# Patient Record
Sex: Female | Born: 1980 | Hispanic: No | Marital: Married | Smoking: Never smoker
Health system: Southern US, Community
[De-identification: ages and names within clinical notes are randomized; demographics above are authoritative.]

## PROBLEM LIST (undated history)

## (undated) ENCOUNTER — Inpatient Hospital Stay (HOSPITAL_COMMUNITY): Payer: Self-pay

## (undated) DIAGNOSIS — K589 Irritable bowel syndrome without diarrhea: Secondary | ICD-10-CM

## (undated) DIAGNOSIS — M329 Systemic lupus erythematosus, unspecified: Secondary | ICD-10-CM

## (undated) DIAGNOSIS — IMO0002 Reserved for concepts with insufficient information to code with codable children: Secondary | ICD-10-CM

## (undated) HISTORY — PX: MULTIPLE TOOTH EXTRACTIONS: SHX2053

## (undated) HISTORY — DX: Irritable bowel syndrome, unspecified: K58.9

---

## 2010-08-14 ENCOUNTER — Emergency Department (HOSPITAL_COMMUNITY): Payer: Self-pay

## 2010-08-14 ENCOUNTER — Emergency Department (HOSPITAL_COMMUNITY)
Admission: EM | Admit: 2010-08-14 | Discharge: 2010-08-14 | Disposition: A | Discharge status: Home / Self Care | Payer: Self-pay | Attending: Emergency Medicine | Admitting: Emergency Medicine

## 2010-08-14 DIAGNOSIS — R1013 Epigastric pain: Secondary | ICD-10-CM | POA: Insufficient documentation

## 2010-08-14 DIAGNOSIS — B3731 Acute candidiasis of vulva and vagina: Secondary | ICD-10-CM | POA: Insufficient documentation

## 2010-08-14 DIAGNOSIS — O210 Mild hyperemesis gravidarum: Secondary | ICD-10-CM | POA: Insufficient documentation

## 2010-08-14 DIAGNOSIS — B373 Candidiasis of vulva and vagina: Secondary | ICD-10-CM | POA: Insufficient documentation

## 2010-08-14 DIAGNOSIS — R109 Unspecified abdominal pain: Secondary | ICD-10-CM | POA: Insufficient documentation

## 2010-08-14 DIAGNOSIS — O99891 Other specified diseases and conditions complicating pregnancy: Secondary | ICD-10-CM | POA: Insufficient documentation

## 2010-08-14 DIAGNOSIS — O2390 Unspecified genitourinary tract infection in pregnancy, unspecified trimester: Secondary | ICD-10-CM | POA: Insufficient documentation

## 2010-08-14 LAB — WET PREP, GENITAL
Clue Cells Wet Prep HPF POC: NONE SEEN
Trich, Wet Prep: NONE SEEN

## 2010-08-14 LAB — URINALYSIS, ROUTINE W REFLEX MICROSCOPIC
Bilirubin Urine: NEGATIVE
Glucose, UA: NEGATIVE mg/dL
Hgb urine dipstick: NEGATIVE
Ketones, ur: NEGATIVE mg/dL
Protein, ur: NEGATIVE mg/dL
Urobilinogen, UA: 0.2 mg/dL (ref 0.0–1.0)

## 2010-08-14 LAB — POCT PREGNANCY, URINE: Preg Test, Ur: POSITIVE

## 2010-08-14 LAB — URINE MICROSCOPIC-ADD ON

## 2010-08-15 LAB — GC/CHLAMYDIA PROBE AMP, GENITAL
Chlamydia, DNA Probe: NEGATIVE
GC Probe Amp, Genital: NEGATIVE

## 2010-08-25 ENCOUNTER — Inpatient Hospital Stay (HOSPITAL_COMMUNITY)
Admission: AD | Admit: 2010-08-25 | Discharge: 2010-08-25 | Disposition: A | Discharge status: Home / Self Care | Payer: Self-pay | Source: Ambulatory Visit | Attending: Obstetrics & Gynecology | Admitting: Obstetrics & Gynecology

## 2010-09-20 ENCOUNTER — Encounter (HOSPITAL_COMMUNITY): Payer: Self-pay

## 2010-09-20 ENCOUNTER — Inpatient Hospital Stay (HOSPITAL_COMMUNITY)
Admission: AD | Admit: 2010-09-20 | Discharge: 2010-09-20 | Disposition: A | Discharge status: Home / Self Care | Payer: Medicaid Other | Source: Ambulatory Visit | Attending: Obstetrics and Gynecology | Admitting: Obstetrics and Gynecology

## 2010-09-20 ENCOUNTER — Inpatient Hospital Stay (HOSPITAL_COMMUNITY): Payer: Medicaid Other

## 2010-09-20 DIAGNOSIS — O210 Mild hyperemesis gravidarum: Secondary | ICD-10-CM | POA: Insufficient documentation

## 2010-09-20 DIAGNOSIS — O219 Vomiting of pregnancy, unspecified: Secondary | ICD-10-CM

## 2010-09-20 DIAGNOSIS — O209 Hemorrhage in early pregnancy, unspecified: Secondary | ICD-10-CM

## 2010-09-20 LAB — URINE MICROSCOPIC-ADD ON

## 2010-09-20 LAB — URINALYSIS, ROUTINE W REFLEX MICROSCOPIC
Bilirubin Urine: NEGATIVE
Ketones, ur: NEGATIVE mg/dL
Nitrite: NEGATIVE
Specific Gravity, Urine: >1.03 — ABNORMAL HIGH (ref 1.005–1.030)
Urobilinogen, UA: 0.2 mg/dL (ref 0.0–1.0)

## 2010-09-20 MED ORDER — OMEPRAZOLE 20 MG PO CPDR: 20.0000 mg | DELAYED_RELEASE_CAPSULE | Freq: Every day | ORAL | Status: DC

## 2010-09-20 MED ORDER — ONDANSETRON 8 MG PO TBDP: 8.0000 mg | ORAL_TABLET | Freq: Three times a day (TID) | ORAL | Status: AC | PRN

## 2010-09-20 MED ORDER — ONDANSETRON 8 MG PO TBDP
8.0000 mg | ORAL_TABLET | Freq: Once | ORAL | Status: AC
  Administered 2010-09-20: 8 mg via ORAL
  Filled 2010-09-20: qty 1

## 2010-09-20 MED ORDER — PROMETHAZINE HCL 25 MG PO TABS: 25.0000 mg | ORAL_TABLET | Freq: Four times a day (QID) | ORAL | Status: DC | PRN

## 2010-09-20 MED ORDER — GI COCKTAIL ~~LOC~~
30.0000 mL | Freq: Once | ORAL | Status: AC
  Administered 2010-09-20: 30 mL via ORAL
  Filled 2010-09-20: qty 30

## 2010-09-20 NOTE — ED Provider Notes (Signed)
History   pt is 13weeks 5 days pregnant [id number] from [place] who presents with nausea and vomiting in pregnancy. Pt says that she vomits 3 to 4 times daily.  She said the doctor gave her 10 pills for nausea that helped, but she did not have any more.  She also has epigastric pain after she eats. She also says that she has been spotting most days.  She was seen in July with negative cultures and wet prep.  She has had some lower abdominal cramping and lower back ache.  Her husband wants her to return to [place] to deliver the baby, but she wants to stay in [place].  Pt does not know if it is safe for her to travel such a long distance.  Chief Complaint  Patient presents with  . Morning Sickness   The history is provided by the patient and the spouse. The history is limited by a language barrier.      No past medical history on file.  No past surgical history on file.  No family history on file.  History  Substance Use Topics  . Smoking status: Not on file  . Smokeless tobacco: Not on file  . Alcohol Use: Not on file    Allergies: Allergies not on file  No prescriptions prior to admission    ROS Physical Exam   Blood pressure 116/65, pulse 65, temperature 98.9 F (37.2 C), resp. rate 16, height 5' 2.25" (1.581 m), weight 187 lb 12.8 oz (85.186 kg), last menstrual period 06/16/2010.  Physical Exam  Vitals reviewed. Constitutional: She is oriented to person, place, and time. She appears well-developed and well-nourished.  HENT:  Head: Normocephalic.  Eyes: Pupils are equal, round, and reactive to light.  Neck: Normal range of motion. Neck supple.  Respiratory: Effort normal.  GI: Soft. There is no tenderness.  Genitourinary:       Circumcised female- vagina small amount of yellow discharge in vault- no blood in vault or on cotton tipped swab; cervix closed, nontender; uterus nontender- size difficult to appreciate due to habitus and position- FHT not audible with doppler    Neurological: She is alert and oriented to person, place, and time.  Skin: Skin is warm and dry.  Psychiatric: She has a normal mood and affect.    MAU Course  Procedures Review of ultraound and labs in July- viable single fetus [redacted]w[redacted]d on 08/14/2010 with [place] 03/19/2011 Negative wet prep and cultures from that date Ultrasound showed single living IUP [redacted]w[redacted]d with mod subchorionic bleed  FHR 152. Cervix normal appearance MDM   Assessment and Plan  IUP [redacted]w[redacted]d with mod subchorionic bleed Pt plans to return to [place] in 25 days and will proceed with OB care at that time Advised pt and husband to return if bleeding got heavier- hopefully the bleed will resolve Advised extra water/fluids and activity while traveling and compression stockings  Henriette Hesser 09/20/2010, 4:10 PM

## 2010-09-20 NOTE — Progress Notes (Signed)
Patient having nausea vomiting every day sometimes up to 3 times a day since found out pregnant has not been seen in MAU, had confirmed pregnancy at [facility]

## 2010-09-24 NOTE — ED Provider Notes (Signed)
Agree with above note.  Eileen Holmes 09/24/2010 8:46 AM   

## 2013-12-04 ENCOUNTER — Encounter (HOSPITAL_COMMUNITY): Payer: Self-pay

## 2014-10-24 ENCOUNTER — Encounter (HOSPITAL_COMMUNITY): Payer: Self-pay | Admitting: *Deleted

## 2014-10-24 ENCOUNTER — Inpatient Hospital Stay (HOSPITAL_COMMUNITY)
Admission: AD | Admit: 2014-10-24 | Discharge: 2014-10-24 | Disposition: A | Discharge status: Home / Self Care | Payer: Medicaid Other | Source: Ambulatory Visit | Attending: Family Medicine | Admitting: Family Medicine

## 2014-10-24 DIAGNOSIS — O219 Vomiting of pregnancy, unspecified: Secondary | ICD-10-CM | POA: Diagnosis not present

## 2014-10-24 DIAGNOSIS — O210 Mild hyperemesis gravidarum: Secondary | ICD-10-CM | POA: Diagnosis not present

## 2014-10-24 DIAGNOSIS — M549 Dorsalgia, unspecified: Secondary | ICD-10-CM

## 2014-10-24 DIAGNOSIS — K589 Irritable bowel syndrome without diarrhea: Secondary | ICD-10-CM

## 2014-10-24 DIAGNOSIS — O26891 Other specified pregnancy related conditions, first trimester: Secondary | ICD-10-CM | POA: Diagnosis not present

## 2014-10-24 DIAGNOSIS — Z3A08 8 weeks gestation of pregnancy: Secondary | ICD-10-CM | POA: Diagnosis not present

## 2014-10-24 DIAGNOSIS — O99891 Other specified diseases and conditions complicating pregnancy: Secondary | ICD-10-CM

## 2014-10-24 DIAGNOSIS — O9989 Other specified diseases and conditions complicating pregnancy, childbirth and the puerperium: Secondary | ICD-10-CM

## 2014-10-24 LAB — URINE MICROSCOPIC-ADD ON

## 2014-10-24 LAB — URINALYSIS, ROUTINE W REFLEX MICROSCOPIC
Bilirubin Urine: NEGATIVE
GLUCOSE, UA: NEGATIVE mg/dL
KETONES UR: NEGATIVE mg/dL
LEUKOCYTES UA: NEGATIVE
Nitrite: NEGATIVE
PH: 6 (ref 5.0–8.0)
Protein, ur: NEGATIVE mg/dL
Urobilinogen, UA: 0.2 mg/dL (ref 0.0–1.0)

## 2014-10-24 LAB — WET PREP, GENITAL
Clue Cells Wet Prep HPF POC: NONE SEEN
Trich, Wet Prep: NONE SEEN

## 2014-10-24 LAB — POCT PREGNANCY, URINE: Preg Test, Ur: POSITIVE — AB

## 2014-10-24 MED ORDER — DICYCLOMINE HCL 20 MG PO TABS: 20.0000 mg | ORAL_TABLET | Freq: Two times a day (BID) | ORAL | Status: DC | PRN

## 2014-10-24 MED ORDER — PROMETHAZINE HCL 25 MG PO TABS
25.0000 mg | ORAL_TABLET | Freq: Once | ORAL | Status: AC
  Administered 2014-10-24: 25 mg via ORAL
  Filled 2014-10-24: qty 1

## 2014-10-24 MED ORDER — PROMETHAZINE HCL 25 MG PO TABS: 25.0000 mg | ORAL_TABLET | Freq: Four times a day (QID) | ORAL | Status: DC | PRN

## 2014-10-24 MED ORDER — METOCLOPRAMIDE HCL 10 MG PO TABS
10.0000 mg | ORAL_TABLET | Freq: Once | ORAL | Status: AC
  Administered 2014-10-24: 10 mg via ORAL
  Filled 2014-10-24: qty 1

## 2014-10-24 MED ORDER — METOCLOPRAMIDE HCL 10 MG PO TABS: 10.0000 mg | ORAL_TABLET | Freq: Four times a day (QID) | ORAL | Status: DC | PRN

## 2014-10-24 NOTE — Discharge Instructions (Signed)
Bloating Bloating is the feeling of fullness in your belly. You may feel as though your pants are too tight. Often the cause of bloating is overeating, retaining fluids, or having gas in your bowel. It is also caused by swallowing air and eating foods that cause gas. Irritable bowel syndrome is one of the most common causes of bloating. Constipation is also a common cause. Sometimes more serious problems can cause bloating. SYMPTOMS  Usually there is a feeling of fullness, as though your abdomen is bulged out. There may be mild discomfort.  DIAGNOSIS  Usually no particular testing is necessary for most bloating. If the condition persists and seems to become worse, your caregiver may do additional testing.  TREATMENT   There is no direct treatment for bloating.  Do not put gas into the bowel. Avoid chewing gum and sucking on candy. These tend to make you swallow air. Swallowing air can also be a nervous habit. Try to avoid this.  Avoiding high residue diets will help. Eat foods with soluble fibers (examples include root vegetables, apples, or barley) and substitute dairy products with soy and rice products. This helps irritable bowel syndrome.  If constipation is the cause, then a high residue diet with more fiber will help.  Avoid carbonated beverages.  Over-the-counter preparations are available that help reduce gas. Your pharmacist can help you with this. SEEK MEDICAL CARE IF:   Bloating continues and seems to be getting worse.  You notice a weight gain.  You have a weight loss but the bloating is getting worse.  You have changes in your bowel habits or develop nausea or vomiting. SEEK IMMEDIATE MEDICAL CARE IF:   You develop shortness of breath or swelling in your legs.  You have an increase in abdominal pain or develop chest pain. Document Released: 11/19/2005 Document Revised: 04/13/2011 Document Reviewed: 01/07/2007 [facility] Patient Information 2015 [place], [place]. This  information is not intended to replace advice given to you by your health care provider. Make sure you discuss any questions you have with your health care provider.  Morning Sickness Morning sickness is when you feel sick to your stomach (nauseous) during pregnancy. This nauseous feeling may or may not come with vomiting. It often occurs in the morning but can be a problem any time of day. Morning sickness is most common during the first trimester, but it may continue throughout pregnancy. While morning sickness is unpleasant, it is usually harmless unless you develop severe and continual vomiting (hyperemesis gravidarum). This condition requires more intense treatment.  CAUSES  The cause of morning sickness is not completely known but seems to be related to normal hormonal changes that occur in pregnancy. RISK FACTORS You are at greater risk if you:  Experienced nausea or vomiting before your pregnancy.  Had morning sickness during a previous pregnancy.  Are pregnant with more than one baby, such as twins. TREATMENT  Do not use any medicines (prescription, over-the-counter, or herbal) for morning sickness without first talking to your health care provider. Your health care provider may prescribe or recommend:  Vitamin B6 supplements.  Anti-nausea medicines.  The herbal medicine ginger. HOME CARE INSTRUCTIONS   Only take over-the-counter or prescription medicines as directed by your health care provider.  Taking multivitamins before getting pregnant can prevent or decrease the severity of morning sickness in most women.  Eat a piece of dry toast or unsalted crackers before getting out of bed in the morning.  Eat five or six small meals a day.  Eat dry and bland foods (rice, baked potato). Foods high in carbohydrates are often helpful.  Do not drink liquids with your meals. Drink liquids between meals.  Avoid greasy, fatty, and spicy foods.  Get someone to cook for you if the  smell of any food causes nausea and vomiting.  If you feel nauseous after taking prenatal vitamins, take the vitamins at night or with a snack.  Snack on protein foods (nuts, yogurt, cheese) between meals if you are hungry.  Eat unsweetened gelatins for desserts.  Wearing an acupressure wristband (worn for sea sickness) may be helpful.  Acupuncture may be helpful.  Do not smoke.  Get a humidifier to keep the air in your house free of odors.  Get plenty of fresh air. SEEK MEDICAL CARE IF:   Your home remedies are not working, and you need medicine.  You feel dizzy or lightheaded.  You are losing weight. SEEK IMMEDIATE MEDICAL CARE IF:   You have persistent and uncontrolled nausea and vomiting.  You pass out (faint). MAKE SURE YOU:  Understand these instructions.  Will watch your condition.  Will get help right away if you are not doing well or get worse. Document Released: 03/12/2006 Document Revised: 01/24/2013 Document Reviewed: 07/06/2012 [facility] Patient Information 2015 [place], [place]. This information is not intended to replace advice given to you by your health care provider. Make sure you discuss any questions you have with your health care provider.  Prenatal Care [facility]    [facility]  [phone]     Phone: [phone]          [facility]                      [facility]   [place]     Phone: [phone]  Phone: [phone]         [facility]     Phone: [phone]  Phone: [phone]           [facility] [place]                hone: [phone]  Phone: [phone]         [facility] [address]      Phone: [phone]  Phone: [phone]         [facility]                Phone: [phone]  [facility]    [address] [place])          Phone: [phone] [facility]   Phone: [phone]

## 2014-10-24 NOTE — MAU Provider Note (Signed)
History     CSN: [id number]  Arrival date and time: 10/24/14 1417   First Provider Initiated Contact with Patient 10/24/14 1622      Chief Complaint  Patient presents with  . Emesis  . Back Pain   HPI This is a 34 y.o. female at [redacted]w[redacted]d by LMP who presents with c/o vomiting for the past 2-3 weeks. Has not tried anything for this.  Has also had some intermittent low back pain. Has occasional headaches.   Has a longstanding problem with IBS-Diarrhea and was taking a medication prescribed in [place] called Trimebutine for IBS-D (not recommended in pregnancy).  She stopped it when she found out she was pregnant. Wants something for the intestinal cramps she gets with this. Denies vaginal bleeding.   RN Note: Pos UPT @ [facility] 2 weeks ago, has been vomiting everything for the last 2-3 weeks. Also has had lower back pain for 3 days, intermittent HA. Denies bleeding or discharge.           OB History    Gravida Para Term Preterm AB TAB SAB Ectopic Multiple Living   Past Medical History  Diagnosis Date  . No pertinent past medical history     Past Surgical History  Procedure Laterality Date  . Cesarean section      No family history on file.  Social History  Substance Use Topics  . Smoking status: Never Smoker   . Smokeless tobacco: Not on file  . Alcohol Use: No    Allergies: No Known Allergies  Prescriptions prior to admission  Medication Sig Dispense Refill Last Dose  . omeprazole (PRILOSEC) 20 MG capsule Take 1 capsule (20 mg total) by mouth daily. 30 capsule 0   . ondansetron (ZOFRAN-ODT) 8 MG disintegrating tablet Take 8 mg by mouth once. For nausea   09/05/2010 at Unknown  . PRESCRIPTION MEDICATION         Review of Systems  Constitutional: Negative for fever, chills and malaise/fatigue.  Eyes: Negative for blurred vision.  Gastrointestinal: Positive for nausea, vomiting and diarrhea (IBS type). Negative for abdominal pain and  constipation.  Genitourinary: Negative for dysuria and flank pain.  Musculoskeletal: Positive for back pain.  Neurological: Positive for headaches. Negative for dizziness and weakness.   Physical Exam   Blood pressure 119/77, pulse 65, temperature 98.3 F (36.8 C), temperature source Oral, resp. rate 18, height 5' 5.5" (1.664 m), weight 192 lb 12.8 oz (87.454 kg), last menstrual period 08/27/2014, unknown if currently breastfeeding.  Physical Exam  Constitutional: She is oriented to person, place, and time. She appears well-developed and well-nourished. No distress.  HENT:  Head: Normocephalic.  Cardiovascular: Normal rate and regular rhythm.   Respiratory: Effort normal and breath sounds normal. No respiratory distress.  GI: Soft. She exhibits no distension. There is no tenderness. There is no rebound and no guarding.  Genitourinary: Vagina normal. No vaginal discharge found.  Bedside US done which showed a viable live fetus c/w dates at 8-9 weeks (but I did not do CRL).  FHR 160s. Fetus moved during exam. Normal amniotic sac. Yolk sac seen.   Cultures and wet prep obtained  Musculoskeletal: Normal range of motion.  Neurological: She is alert and oriented to person, place, and time.  Skin: Skin is warm and dry.  Psychiatric: She has a normal mood and affect.    MAU Course  Procedures  MDM Cultures and  wet prep obtained Bedside US done showing live fetus.  Phenergan and Reglan given PO with good results for nausea. States nausea improved. No overt dehydration noted, VS are stable I don't feel she needs IV hydration at this time She is able to keep down POs with oral medication  Results for Eileen, Holmes (MRN [id number]) as of 10/27/2014 23:55  Ref. Range 10/24/2014 17:06  Yeast Wet Prep HPF POC Latest Ref Range: NONE SEEN  FEW (A)  Trich, Wet Prep Latest Ref Range: NONE SEEN  NONE SEEN  Clue Cells Wet Prep HPF POC Latest Ref Range: NONE SEEN  NONE SEEN  WBC, Wet Prep HPF POC  Latest Ref Range: NONE SEEN  FEW (A)  Results for Eileen, Holmes (MRN [id number]) as of 10/27/2014 23:55  Ref. Range 10/24/2014 15:10  Appearance Latest Ref Range: CLEAR  CLEAR  Bacteria, UA Latest Ref Range: RARE  RARE  Bilirubin Urine Latest Ref Range: NEGATIVE  NEGATIVE  Color, Urine Latest Ref Range: YELLOW  YELLOW  Glucose Latest Ref Range: NEGATIVE mg/dL NEGATIVE  Hgb urine dipstick Latest Ref Range: NEGATIVE  TRACE (A)  Ketones, ur Latest Ref Range: NEGATIVE mg/dL NEGATIVE  Leukocytes, UA Latest Ref Range: NEGATIVE  NEGATIVE  Nitrite Latest Ref Range: NEGATIVE  NEGATIVE  pH Latest Ref Range: 5.0-8.0  6.0  Protein Latest Ref Range: NEGATIVE mg/dL NEGATIVE  RBC / HPF Latest Ref Range: <3 RBC/hpf 0-2  Specific Gravity, Urine Latest Ref Range: 1.005-1.030  >1.030 (H)  Squamous Epithelial / LPF Latest Ref Range: RARE  RARE  Urine-Other Unknown MUCOUS PRESENT  Urobilinogen, UA Latest Ref Range: 0.0-1.0 mg/dL 0.2  WBC, UA Latest Ref Range: <3 WBC/hpf 0-2    Assessment and Plan  A:  SIUP at [redacted]w[redacted]d        Nausea and vomiting of pregnancy       IBS-Diarrhea with cramping       Live fetus  P:  Discharge home       Discussed findings       Reviewed she should not take med from [place], not safe in pregnancy       Will give Rx for Bentyl for GI cramps       Rx's provided for Phenergan and Reglan      Encouraged to start prenatal care, list of providers given         Medication List    STOP taking these medications        omeprazole 20 MG capsule  Commonly known as:  PRILOSEC      TAKE these medications        dicyclomine 20 MG tablet  Commonly known as:  BENTYL  Take 1 tablet (20 mg total) by mouth 2 (two) times daily as needed for spasms.     metoCLOPramide 10 MG tablet  Commonly known as:  REGLAN  Take 1 tablet (10 mg total) by mouth every 6 (six) hours as needed for nausea.     prenatal multivitamin Tabs tablet  Take 1 tablet by mouth daily at 12 noon.     promethazine  25 MG tablet  Commonly known as:  PHENERGAN  Take 1 tablet (25 mg total) by mouth every 6 (six) hours as needed for nausea or vomiting.         [name] 10/24/2014, 4:22 PM

## 2014-10-24 NOTE — MAU Note (Signed)
Pos UPT @ [facility]  2 weeks ago, has been vomiting everything for the last 2-3 weeks.  Also has had lower back pain for 3 days, intermittent HA.  Denies bleeding or discharge.

## 2014-10-25 LAB — GC/CHLAMYDIA PROBE AMP (~~LOC~~) NOT AT ARMC
CHLAMYDIA, DNA PROBE: NEGATIVE
NEISSERIA GONORRHEA: NEGATIVE

## 2014-10-27 ENCOUNTER — Encounter (HOSPITAL_COMMUNITY): Payer: Self-pay | Admitting: Advanced Practice Midwife

## 2014-11-22 ENCOUNTER — Other Ambulatory Visit (HOSPITAL_COMMUNITY): Payer: Self-pay | Admitting: Nurse Practitioner

## 2014-11-22 DIAGNOSIS — Z3A13 13 weeks gestation of pregnancy: Secondary | ICD-10-CM

## 2014-11-22 DIAGNOSIS — O09522 Supervision of elderly multigravida, second trimester: Secondary | ICD-10-CM

## 2014-11-22 DIAGNOSIS — Z3682 Encounter for antenatal screening for nuchal translucency: Secondary | ICD-10-CM

## 2014-11-22 LAB — OB RESULTS CONSOLE RUBELLA ANTIBODY, IGM: RUBELLA: IMMUNE

## 2014-11-22 LAB — OB RESULTS CONSOLE GC/CHLAMYDIA
CHLAMYDIA, DNA PROBE: NEGATIVE
GC PROBE AMP, GENITAL: NEGATIVE

## 2014-11-22 LAB — OB RESULTS CONSOLE RPR: RPR: NONREACTIVE

## 2014-11-22 LAB — OB RESULTS CONSOLE HIV ANTIBODY (ROUTINE TESTING): HIV: NONREACTIVE

## 2014-11-22 LAB — OB RESULTS CONSOLE HEPATITIS B SURFACE ANTIGEN: Hepatitis B Surface Ag: NEGATIVE

## 2014-11-22 LAB — OB RESULTS CONSOLE ABO/RH: RH Type: NEGATIVE

## 2014-11-22 LAB — OB RESULTS CONSOLE ANTIBODY SCREEN: ANTIBODY SCREEN: NEGATIVE

## 2014-11-27 ENCOUNTER — Ambulatory Visit (HOSPITAL_COMMUNITY)
Admission: RE | Admit: 2014-11-27 | Discharge: 2014-11-27 | Disposition: A | Discharge status: Home / Self Care | Payer: Medicaid Other | Source: Ambulatory Visit | Attending: Nurse Practitioner | Admitting: Nurse Practitioner

## 2014-11-27 ENCOUNTER — Encounter: Payer: Medicaid Other | Admitting: Advanced Practice Midwife

## 2014-11-27 ENCOUNTER — Encounter (HOSPITAL_COMMUNITY): Payer: Self-pay

## 2014-11-27 ENCOUNTER — Other Ambulatory Visit (HOSPITAL_COMMUNITY): Payer: Self-pay | Admitting: Nurse Practitioner

## 2014-11-27 DIAGNOSIS — Z3682 Encounter for antenatal screening for nuchal translucency: Secondary | ICD-10-CM

## 2014-11-27 DIAGNOSIS — Z3A13 13 weeks gestation of pregnancy: Secondary | ICD-10-CM

## 2014-11-27 DIAGNOSIS — O09522 Supervision of elderly multigravida, second trimester: Secondary | ICD-10-CM

## 2014-11-27 DIAGNOSIS — O09529 Supervision of elderly multigravida, unspecified trimester: Secondary | ICD-10-CM

## 2014-11-27 DIAGNOSIS — O34219 Maternal care for unspecified type scar from previous cesarean delivery: Secondary | ICD-10-CM

## 2014-11-27 DIAGNOSIS — Z36 Encounter for antenatal screening of mother: Secondary | ICD-10-CM | POA: Insufficient documentation

## 2014-11-27 DIAGNOSIS — O09291 Supervision of pregnancy with other poor reproductive or obstetric history, first trimester: Secondary | ICD-10-CM

## 2014-11-27 DIAGNOSIS — Z3491 Encounter for supervision of normal pregnancy, unspecified, first trimester: Secondary | ICD-10-CM

## 2014-11-27 DIAGNOSIS — O09521 Supervision of elderly multigravida, first trimester: Secondary | ICD-10-CM

## 2014-11-29 ENCOUNTER — Encounter (HOSPITAL_COMMUNITY): Payer: Self-pay

## 2014-11-29 DIAGNOSIS — O09529 Supervision of elderly multigravida, unspecified trimester: Secondary | ICD-10-CM | POA: Insufficient documentation

## 2014-11-29 DIAGNOSIS — Z3A13 13 weeks gestation of pregnancy: Secondary | ICD-10-CM | POA: Insufficient documentation

## 2014-11-29 NOTE — Progress Notes (Signed)
Genetic Counseling  High-Risk Gestation Note  Appointment Date:  11/27/2014 Referred By: Eileen Ao, NP Date of Birth:  [birth year]   Pregnancy History: [id number] Estimated Date of Delivery: 05/29/15  Estimated Gestational Age: [redacted]w[redacted]d Attending: Particia Nearing, MD   Ms. Eileen Holmes was seen for genetic counseling because of a maternal age of 34 y.o..   She will be 34 years old at delivery. [employer] Arabic/English interpreter provided interpretation for today's visit. [employer] Genetic counseling Intern, Eileen Holmes, assisted with genetic counseling under my direct supervision.   In Summary:  Maternal age 89 years old at delivery; Reviewed associated risks for fetal aneuploidy  Patient elected for NIPS (Panorama) today; declined amniocentesis  Nuchal translucency ultrasound performed today; within normal range  Advanced paternal age; Discussed associations and additional option of third trimester growth ultrasound  Detailed ultrasound scheduled for 01/01/15  She was counseled regarding maternal age and the association with risk for chromosome conditions due to nondisjunction with aging of the ova.   We reviewed chromosomes, nondisjunction, and the associated 1 in 51 risk for fetal aneuploidy at [redacted]w[redacted]d gestation related to a maternal age of 34 years old at delivery.  She was counseled that the risk for aneuploidy decreases as gestational age increases, accounting for those pregnancies which spontaneously abort.  We specifically discussed Down syndrome (trisomy 54), trisomies 24 and 68, and sex chromosome aneuploidies (47,XXX and 47,XXY) including the common features and prognoses of each.   We reviewed available screening options including First Screen, Quad screen, noninvasive prenatal screening (NIPS)/cell free DNA (cfDNA) testing, and detailed ultrasound.  She was counseled that screening tests are used to modify a patient's a priori risk for aneuploidy, typically based on age. This estimate  provides a pregnancy specific risk assessment. We reviewed the benefits and limitations of each option. Specifically, we discussed the conditions for which each test screens, the detection rates, and false positive rates of each. She was also counseled regarding diagnostic testing via [facility] and amniocentesis. We reviewed the approximate 1 in 300-500 risk for complications for amniocentesis, including spontaneous pregnancy loss. After consideration of all the options, she elected to proceed with NIPS (Panorama through [facility] laboratory).  Those results will be available in 8-10 days.  She declined amniocentesis.   She also had nuchal translucency ultrasound performed today.  The report will be documented separately.  The patient would like to return for a detailed ultrasound at ~18+ weeks gestation.  This appointment was scheduled today. She understands that screening tests cannot rule out all birth defects or genetic syndromes. The patient was advised of this limitation and states she still does not want additional testing at this time.   Ms. Eileen Holmes was provided with written information regarding cystic fibrosis (CF) including the carrier frequency and incidence in the African population, the availability of carrier testing and prenatal diagnosis if indicated.  In addition, we discussed that CF is routinely screened for as part of the [place] newborn screening panel.  She previously had CF carrier screening through her OB office, which was negative for the mutations assessed. Thus, her risk to be a CF carrier has been reduced.   Both family histories were reviewed and found to be noncontributory for birth defects, intellectual disability, and known genetic conditions. Without further information regarding the provided family history, an accurate genetic risk cannot be calculated. Further genetic counseling is warranted if more information is obtained.  The father of the pregnancy is reportedly 44 years old.  She was counseled  that advanced paternal age (APA) is defined as paternal age greater than or equal to age 35.  Recent large-scale sequencing studies have shown that approximately 80% of de novo point mutations are of paternal origin.  Many studies have demonstrated a strong correlation between increased paternal age and de novo point mutations.  Although no specific data is available regarding fetal risks for fathers 2445+ years old at conception, it is apparent that the overall risk for single gene conditions is increased.  To estimate the relative increase in risk of a genetic disorder with APA, the heritability of the disease must be considered.  Assuming an approximate 2x increase in risk for conditions that are exclusively paternal in origin, the risk for each individual condition is still relatively low.  It is estimated that the overall chance for a de novo mutation is ~0.5%.  We also discussed the wide range of conditions which can be caused by new dominant gene mutations (achondroplasia, neurofibromatosis, Marfan syndrome etc.).  They were counseled that genetic testing for each individual single gene condition is not warranted or available unless ultrasound or family history concerns lend suspicion to a specific condition.  We discussed the recommendation for a detailed ultrasound at 18+ weeks gestation and a follow up ultrasound at ~28 weeks to monitor fetal growth.  Ms. Eileen Holmes denied exposure to environmental toxins or chemical agents. She denied the use of alcohol, tobacco or street drugs. She denied significant viral illnesses during the course of her pregnancy. Her medical and surgical histories were noncontributory.   I counseled Ms. Eileen Holmes regarding the above risks and available options.  The approximate face-to-face time with the genetic counselor was 45 minutes.  Eileen PlowmanKaren Pavle Wiler, MS,  Certified Genetic Counselor 11/29/2014

## 2014-12-06 ENCOUNTER — Telehealth (HOSPITAL_COMMUNITY): Payer: Self-pay | Admitting: MS"

## 2014-12-06 NOTE — Telephone Encounter (Signed)
Called Eileen Holmes to discuss her prenatal cell free DNA test results.  Eileen Holmes had Panorama testing through [facility] laboratories.  Testing was offered because of advanced maternal age.   The patient was identified by name and DOB.  We reviewed that these are within normal limits, showing a less than 1 in 10,000 risk for trisomies 21, 18 and 13, and monosomy X (Turner syndrome).  In addition, the risk for triploidy/vanishing twin and sex chromosome trisomies (47,XXX and 47,XXY) was also low risk. We reviewed that this testing identifies > 99% of pregnancies with trisomy 5621, trisomy 6813, sex chromosome trisomies (47,XXX and 47,XXY), and triploidy. The detection rate for trisomy 18 is 96%.  The detection rate for monosomy X is ~92%.  The false positive rate is <0.1% for all conditions. Testing was also consistent with female fetal sex.  She understands that this testing does not identify all genetic conditions.  All questions were answered to her satisfaction, she was encouraged to call with additional questions or concerns.  Quinn PlowmanKaren Nita Whitmire, MS Patent attorneyCertified Genetic Counselor

## 2015-01-01 ENCOUNTER — Ambulatory Visit (HOSPITAL_COMMUNITY)
Admission: RE | Admit: 2015-01-01 | Discharge: 2015-01-01 | Disposition: A | Discharge status: Home / Self Care | Payer: Medicaid Other | Source: Ambulatory Visit | Attending: Maternal and Fetal Medicine | Admitting: Maternal and Fetal Medicine

## 2015-01-01 ENCOUNTER — Encounter (HOSPITAL_COMMUNITY): Payer: Self-pay

## 2015-01-01 ENCOUNTER — Other Ambulatory Visit (HOSPITAL_COMMUNITY): Payer: Self-pay | Admitting: Maternal and Fetal Medicine

## 2015-01-01 VITALS — BP 112/68 | HR 83 | Wt 206.8 lb

## 2015-01-01 DIAGNOSIS — Z3A18 18 weeks gestation of pregnancy: Secondary | ICD-10-CM

## 2015-01-01 DIAGNOSIS — O09899 Supervision of other high risk pregnancies, unspecified trimester: Secondary | ICD-10-CM

## 2015-01-01 DIAGNOSIS — Z36 Encounter for antenatal screening of mother: Secondary | ICD-10-CM | POA: Insufficient documentation

## 2015-01-01 DIAGNOSIS — O09522 Supervision of elderly multigravida, second trimester: Secondary | ICD-10-CM

## 2015-01-01 DIAGNOSIS — Z98891 History of uterine scar from previous surgery: Secondary | ICD-10-CM

## 2015-01-01 DIAGNOSIS — O34219 Maternal care for unspecified type scar from previous cesarean delivery: Secondary | ICD-10-CM | POA: Diagnosis not present

## 2015-01-01 DIAGNOSIS — O09292 Supervision of pregnancy with other poor reproductive or obstetric history, second trimester: Secondary | ICD-10-CM | POA: Insufficient documentation

## 2015-01-01 DIAGNOSIS — O09529 Supervision of elderly multigravida, unspecified trimester: Secondary | ICD-10-CM

## 2015-01-01 DIAGNOSIS — O99119 Other diseases of the blood and blood-forming organs and certain disorders involving the immune mechanism complicating pregnancy, unspecified trimester: Secondary | ICD-10-CM

## 2015-01-01 DIAGNOSIS — O09299 Supervision of pregnancy with other poor reproductive or obstetric history, unspecified trimester: Secondary | ICD-10-CM

## 2015-01-03 ENCOUNTER — Other Ambulatory Visit (HOSPITAL_COMMUNITY): Payer: Self-pay

## 2015-02-26 ENCOUNTER — Other Ambulatory Visit (HOSPITAL_COMMUNITY): Payer: Self-pay | Admitting: Maternal and Fetal Medicine

## 2015-02-26 ENCOUNTER — Ambulatory Visit (HOSPITAL_COMMUNITY)
Admission: RE | Admit: 2015-02-26 | Discharge: 2015-02-26 | Disposition: A | Discharge status: Home / Self Care | Payer: Medicaid Other | Source: Ambulatory Visit | Attending: Nurse Practitioner | Admitting: Nurse Practitioner

## 2015-02-26 ENCOUNTER — Encounter (HOSPITAL_COMMUNITY): Payer: Self-pay

## 2015-02-26 DIAGNOSIS — D689 Coagulation defect, unspecified: Secondary | ICD-10-CM

## 2015-02-26 DIAGNOSIS — O09529 Supervision of elderly multigravida, unspecified trimester: Secondary | ICD-10-CM

## 2015-02-26 DIAGNOSIS — Z3A26 26 weeks gestation of pregnancy: Secondary | ICD-10-CM

## 2015-02-26 DIAGNOSIS — O34219 Maternal care for unspecified type scar from previous cesarean delivery: Secondary | ICD-10-CM | POA: Insufficient documentation

## 2015-02-26 DIAGNOSIS — O09899 Supervision of other high risk pregnancies, unspecified trimester: Secondary | ICD-10-CM

## 2015-02-26 DIAGNOSIS — O09292 Supervision of pregnancy with other poor reproductive or obstetric history, second trimester: Secondary | ICD-10-CM

## 2015-02-26 DIAGNOSIS — O99119 Other diseases of the blood and blood-forming organs and certain disorders involving the immune mechanism complicating pregnancy, unspecified trimester: Secondary | ICD-10-CM

## 2015-02-26 DIAGNOSIS — O09522 Supervision of elderly multigravida, second trimester: Secondary | ICD-10-CM | POA: Diagnosis not present

## 2015-03-13 ENCOUNTER — Encounter (HOSPITAL_COMMUNITY): Payer: Self-pay | Admitting: Obstetrics & Gynecology

## 2015-03-26 ENCOUNTER — Encounter (HOSPITAL_COMMUNITY): Payer: Self-pay

## 2015-03-26 ENCOUNTER — Ambulatory Visit (HOSPITAL_COMMUNITY)
Admission: RE | Admit: 2015-03-26 | Discharge: 2015-03-26 | Disposition: A | Discharge status: Home / Self Care | Payer: Medicaid Other | Source: Ambulatory Visit | Attending: Nurse Practitioner | Admitting: Nurse Practitioner

## 2015-03-26 ENCOUNTER — Encounter (HOSPITAL_COMMUNITY): Payer: Self-pay | Admitting: *Deleted

## 2015-03-26 DIAGNOSIS — Z3A31 31 weeks gestation of pregnancy: Secondary | ICD-10-CM | POA: Diagnosis not present

## 2015-03-26 DIAGNOSIS — O09293 Supervision of pregnancy with other poor reproductive or obstetric history, third trimester: Secondary | ICD-10-CM | POA: Diagnosis not present

## 2015-03-26 DIAGNOSIS — O34219 Maternal care for unspecified type scar from previous cesarean delivery: Secondary | ICD-10-CM | POA: Diagnosis not present

## 2015-03-26 DIAGNOSIS — O09529 Supervision of elderly multigravida, unspecified trimester: Secondary | ICD-10-CM

## 2015-04-27 ENCOUNTER — Other Ambulatory Visit: Payer: Self-pay | Admitting: Obstetrics & Gynecology

## 2015-05-02 LAB — OB RESULTS CONSOLE GBS: GBS: NEGATIVE

## 2015-05-07 ENCOUNTER — Encounter (HOSPITAL_COMMUNITY): Payer: Self-pay

## 2015-05-07 ENCOUNTER — Ambulatory Visit (HOSPITAL_COMMUNITY)
Admission: RE | Admit: 2015-05-07 | Discharge: 2015-05-07 | Disposition: A | Discharge status: Home / Self Care | Payer: Medicaid Other | Source: Ambulatory Visit | Attending: Nurse Practitioner | Admitting: Nurse Practitioner

## 2015-05-07 DIAGNOSIS — O09529 Supervision of elderly multigravida, unspecified trimester: Secondary | ICD-10-CM | POA: Diagnosis not present

## 2015-05-07 DIAGNOSIS — O09299 Supervision of pregnancy with other poor reproductive or obstetric history, unspecified trimester: Secondary | ICD-10-CM | POA: Diagnosis not present

## 2015-05-07 DIAGNOSIS — Z3A36 36 weeks gestation of pregnancy: Secondary | ICD-10-CM | POA: Insufficient documentation

## 2015-05-07 DIAGNOSIS — O2690 Pregnancy related conditions, unspecified, unspecified trimester: Secondary | ICD-10-CM | POA: Insufficient documentation

## 2015-05-14 ENCOUNTER — Encounter (HOSPITAL_COMMUNITY): Payer: Self-pay | Admitting: *Deleted

## 2015-05-14 ENCOUNTER — Telehealth (HOSPITAL_COMMUNITY): Payer: Self-pay | Admitting: *Deleted

## 2015-05-14 NOTE — Telephone Encounter (Signed)
Preadmission screen Interpreter number [phone]

## 2015-05-15 ENCOUNTER — Encounter (HOSPITAL_COMMUNITY): Payer: Self-pay | Admitting: *Deleted

## 2015-05-20 NOTE — Patient Instructions (Signed)
20 Meda Klinefeltereven Delo  05/20/2015   Your procedure is scheduled on:  05/21/2015  Enter through the Main Entrance of [facility] at 0900 AM.  Pick up the phone at the desk and dial [phone].   Call this number if you have problems the morning of surgery: [phone]   Remember:   Do not eat food:After Midnight.  Do not drink clear liquids: After Midnight.  Take these medicines the morning of surgery with A SIP OF WATER: none   Do not wear jewelry, make-up or nail polish.  Do not wear lotions, powders, or perfumes. You may wear deodorant.  Do not shave 48 hours prior to surgery.  Do not bring valuables to the hospital.  [facility] is not   responsible for any belongings or valuables brought to the hospital.  Contacts, dentures or bridgework may not be worn into surgery.  Leave suitcase in the car. After surgery it may be brought to your room.  For patients admitted to the hospital, checkout time is 11:00 AM the day of              discharge.   Patients discharged the day of surgery will not be allowed to drive             home.  Name and phone number of your driver: na  Special Instructions:   Shower using CHG 2 nights before surgery and the night before surgery.  If you shower the day of surgery use CHG.  Use special wash - you have one bottle of CHG for all showers.  You should use approximately 1/3 of the bottle for each shower.   Please read over the following fact sheets that you were given:   Surgical Site Infection Prevention

## 2015-05-21 ENCOUNTER — Encounter (HOSPITAL_COMMUNITY)
Admission: RE | Admit: 2015-05-21 | Discharge: 2015-05-21 | Disposition: A | Discharge status: Home / Self Care | Payer: Medicaid Other | Source: Ambulatory Visit | Attending: Obstetrics & Gynecology | Admitting: Obstetrics & Gynecology

## 2015-05-21 LAB — CBC
HEMATOCRIT: 28.7 % — AB (ref 36.0–46.0)
HEMOGLOBIN: 9.1 g/dL — AB (ref 12.0–15.0)
MCH: 25.8 pg — ABNORMAL LOW (ref 26.0–34.0)
MCHC: 31.7 g/dL (ref 30.0–36.0)
MCV: 81.3 fL (ref 78.0–100.0)
Platelets: 199 10*3/uL (ref 150–400)
RBC: 3.53 MIL/uL — ABNORMAL LOW (ref 3.87–5.11)
RDW: 16.3 % — ABNORMAL HIGH (ref 11.5–15.5)
WBC: 9.7 10*3/uL (ref 4.0–10.5)

## 2015-05-21 LAB — ABO/RH: ABO/RH(D): AB POS

## 2015-05-22 ENCOUNTER — Encounter (HOSPITAL_COMMUNITY): Payer: Self-pay | Admitting: Anesthesiology

## 2015-05-22 ENCOUNTER — Inpatient Hospital Stay (HOSPITAL_COMMUNITY): Payer: Medicaid Other | Admitting: Anesthesiology

## 2015-05-22 ENCOUNTER — Inpatient Hospital Stay (HOSPITAL_COMMUNITY)
Admission: RE | Admit: 2015-05-22 | Discharge: 2015-05-25 | DRG: 766 | Disposition: A | Discharge status: Home / Self Care | Payer: Medicaid Other | Source: Ambulatory Visit | Attending: Obstetrics & Gynecology | Admitting: Obstetrics & Gynecology

## 2015-05-22 ENCOUNTER — Encounter (HOSPITAL_COMMUNITY)
Admission: RE | Disposition: A | Discharge status: Home / Self Care | Payer: Self-pay | Source: Ambulatory Visit | Attending: Obstetrics & Gynecology

## 2015-05-22 ENCOUNTER — Encounter (HOSPITAL_COMMUNITY): Payer: Self-pay | Admitting: Obstetrics & Gynecology

## 2015-05-22 DIAGNOSIS — O9081 Anemia of the puerperium: Secondary | ICD-10-CM | POA: Diagnosis not present

## 2015-05-22 DIAGNOSIS — D649 Anemia, unspecified: Secondary | ICD-10-CM | POA: Diagnosis not present

## 2015-05-22 DIAGNOSIS — K589 Irritable bowel syndrome without diarrhea: Secondary | ICD-10-CM | POA: Diagnosis present

## 2015-05-22 DIAGNOSIS — K219 Gastro-esophageal reflux disease without esophagitis: Secondary | ICD-10-CM | POA: Diagnosis present

## 2015-05-22 DIAGNOSIS — O34211 Maternal care for low transverse scar from previous cesarean delivery: Secondary | ICD-10-CM | POA: Diagnosis present

## 2015-05-22 DIAGNOSIS — O9962 Diseases of the digestive system complicating childbirth: Secondary | ICD-10-CM | POA: Diagnosis present

## 2015-05-22 DIAGNOSIS — O135 Gestational [pregnancy-induced] hypertension without significant proteinuria, complicating the puerperium: Secondary | ICD-10-CM | POA: Diagnosis present

## 2015-05-22 DIAGNOSIS — Z302 Encounter for sterilization: Secondary | ICD-10-CM

## 2015-05-22 DIAGNOSIS — Z98891 History of uterine scar from previous surgery: Secondary | ICD-10-CM

## 2015-05-22 DIAGNOSIS — Z3A39 39 weeks gestation of pregnancy: Secondary | ICD-10-CM

## 2015-05-22 LAB — PREPARE RBC (CROSSMATCH)

## 2015-05-22 LAB — SYPHILIS: RPR W/REFLEX TO RPR TITER AND TREPONEMAL ANTIBODIES, TRADITIONAL SCREENING AND DIAGNOSIS ALGORITHM: RPR Ser Ql: NONREACTIVE

## 2015-05-22 SURGERY — Surgical Case
Anesthesia: Spinal | Site: Abdomen | Laterality: Bilateral

## 2015-05-22 MED ORDER — DIPHENHYDRAMINE HCL 25 MG PO CAPS: 25.0000 mg | ORAL_CAPSULE | Freq: Four times a day (QID) | ORAL | Status: DC | PRN

## 2015-05-22 MED ORDER — ERYTHROMYCIN 5 MG/GM OP OINT
TOPICAL_OINTMENT | OPHTHALMIC | Status: AC
  Filled 2015-05-22: qty 1

## 2015-05-22 MED ORDER — NALBUPHINE HCL 10 MG/ML IJ SOLN: 5.0000 mg | INTRAMUSCULAR | Status: DC | PRN

## 2015-05-22 MED ORDER — IBUPROFEN 600 MG PO TABS
600.0000 mg | ORAL_TABLET | Freq: Four times a day (QID) | ORAL | Status: DC
  Administered 2015-05-23 – 2015-05-25 (×11): 600 mg via ORAL
  Filled 2015-05-22 (×11): qty 1

## 2015-05-22 MED ORDER — METOCLOPRAMIDE HCL 5 MG/ML IJ SOLN: 10.0000 mg | Freq: Once | INTRAMUSCULAR | Status: DC | PRN

## 2015-05-22 MED ORDER — SIMETHICONE 80 MG PO CHEW
80.0000 mg | CHEWABLE_TABLET | ORAL | Status: DC
  Administered 2015-05-23 – 2015-05-24 (×3): 80 mg via ORAL
  Filled 2015-05-22 (×3): qty 1

## 2015-05-22 MED ORDER — ACETAMINOPHEN 325 MG PO TABS
650.0000 mg | ORAL_TABLET | ORAL | Status: DC | PRN
  Administered 2015-05-23 – 2015-05-25 (×5): 650 mg via ORAL
  Filled 2015-05-22 (×5): qty 2

## 2015-05-22 MED ORDER — OXYCODONE HCL 5 MG PO TABS
10.0000 mg | ORAL_TABLET | ORAL | Status: DC | PRN
  Administered 2015-05-23 – 2015-05-25 (×3): 10 mg via ORAL
  Filled 2015-05-22 (×3): qty 2

## 2015-05-22 MED ORDER — NALOXONE HCL 0.4 MG/ML IJ SOLN: 0.4000 mg | INTRAMUSCULAR | Status: DC | PRN

## 2015-05-22 MED ORDER — MEPERIDINE HCL 25 MG/ML IJ SOLN: 6.2500 mg | INTRAMUSCULAR | Status: DC | PRN

## 2015-05-22 MED ORDER — SIMETHICONE 80 MG PO CHEW: 80.0000 mg | CHEWABLE_TABLET | ORAL | Status: DC | PRN

## 2015-05-22 MED ORDER — FENTANYL CITRATE (PF) 100 MCG/2ML IJ SOLN: 25.0000 ug | INTRAMUSCULAR | Status: DC | PRN

## 2015-05-22 MED ORDER — NALBUPHINE HCL 10 MG/ML IJ SOLN: 5.0000 mg | Freq: Once | INTRAMUSCULAR | Status: AC | PRN

## 2015-05-22 MED ORDER — LACTATED RINGERS IV SOLN: INTRAVENOUS | Status: DC

## 2015-05-22 MED ORDER — CEFAZOLIN SODIUM-DEXTROSE 2-4 GM/100ML-% IV SOLN
2.0000 g | INTRAVENOUS | Status: AC
  Administered 2015-05-22: 2 g via INTRAVENOUS
  Filled 2015-05-22: qty 100

## 2015-05-22 MED ORDER — SCOPOLAMINE 1 MG/3DAYS TD PT72
1.0000 | MEDICATED_PATCH | Freq: Once | TRANSDERMAL | Status: DC
  Administered 2015-05-22: 1.5 mg via TRANSDERMAL

## 2015-05-22 MED ORDER — FENTANYL CITRATE (PF) 100 MCG/2ML IJ SOLN
INTRAMUSCULAR | Status: DC | PRN
  Administered 2015-05-22: 20 ug via INTRATHECAL

## 2015-05-22 MED ORDER — DIBUCAINE 1 % RE OINT: 1.0000 "application " | TOPICAL_OINTMENT | RECTAL | Status: DC | PRN

## 2015-05-22 MED ORDER — OXYTOCIN 10 UNIT/ML IJ SOLN: 2.5000 [IU]/h | INTRAMUSCULAR | Status: AC

## 2015-05-22 MED ORDER — PHENYLEPHRINE 8 MG IN D5W 100 ML (0.08MG/ML) PREMIX OPTIME
INJECTION | INTRAVENOUS | Status: AC
  Filled 2015-05-22: qty 100

## 2015-05-22 MED ORDER — KETOROLAC TROMETHAMINE 30 MG/ML IJ SOLN
INTRAMUSCULAR | Status: AC
  Administered 2015-05-22: 30 mg
  Filled 2015-05-22: qty 1

## 2015-05-22 MED ORDER — DEXTROSE 5 % IV SOLN
1.0000 ug/kg/h | INTRAVENOUS | Status: DC | PRN
  Filled 2015-05-22: qty 2

## 2015-05-22 MED ORDER — PHENYLEPHRINE 40 MCG/ML (10ML) SYRINGE FOR IV PUSH (FOR BLOOD PRESSURE SUPPORT)
PREFILLED_SYRINGE | INTRAVENOUS | Status: AC
  Filled 2015-05-22: qty 10

## 2015-05-22 MED ORDER — MORPHINE SULFATE (PF) 0.5 MG/ML IJ SOLN
INTRAMUSCULAR | Status: AC
Start: 2015-05-22 — End: 2015-05-22
  Filled 2015-05-22: qty 10

## 2015-05-22 MED ORDER — BUPIVACAINE HCL (PF) 0.5 % IJ SOLN
INTRAMUSCULAR | Status: AC
  Filled 2015-05-22: qty 30

## 2015-05-22 MED ORDER — DIPHENHYDRAMINE HCL 25 MG PO CAPS
25.0000 mg | ORAL_CAPSULE | ORAL | Status: DC | PRN
  Filled 2015-05-22: qty 1

## 2015-05-22 MED ORDER — OXYCODONE HCL 5 MG PO TABS
5.0000 mg | ORAL_TABLET | ORAL | Status: DC | PRN
  Administered 2015-05-23 – 2015-05-24 (×4): 5 mg via ORAL
  Filled 2015-05-22 (×4): qty 1

## 2015-05-22 MED ORDER — ZOLPIDEM TARTRATE 5 MG PO TABS: 5.0000 mg | ORAL_TABLET | Freq: Every evening | ORAL | Status: DC | PRN

## 2015-05-22 MED ORDER — FENTANYL CITRATE (PF) 100 MCG/2ML IJ SOLN
INTRAMUSCULAR | Status: AC
  Filled 2015-05-22: qty 2

## 2015-05-22 MED ORDER — OXYTOCIN 10 UNIT/ML IJ SOLN
40.0000 [IU] | INTRAVENOUS | Status: DC | PRN
  Administered 2015-05-22: 40 [IU] via INTRAVENOUS

## 2015-05-22 MED ORDER — ONDANSETRON HCL 4 MG/2ML IJ SOLN: 4.0000 mg | Freq: Three times a day (TID) | INTRAMUSCULAR | Status: DC | PRN

## 2015-05-22 MED ORDER — MORPHINE SULFATE (PF) 0.5 MG/ML IJ SOLN
INTRAMUSCULAR | Status: DC | PRN
  Administered 2015-05-22: .2 mg via INTRATHECAL

## 2015-05-22 MED ORDER — DIPHENHYDRAMINE HCL 50 MG/ML IJ SOLN
12.5000 mg | INTRAMUSCULAR | Status: DC | PRN
  Administered 2015-05-22: 12.5 mg via INTRAVENOUS
  Filled 2015-05-22: qty 1

## 2015-05-22 MED ORDER — SCOPOLAMINE 1 MG/3DAYS TD PT72
MEDICATED_PATCH | TRANSDERMAL | Status: AC
Start: 2015-05-22 — End: 2015-05-25
  Administered 2015-05-22: 1.5 mg via TRANSDERMAL
  Filled 2015-05-22: qty 1

## 2015-05-22 MED ORDER — CEFAZOLIN SODIUM-DEXTROSE 2-3 GM-% IV SOLR
INTRAVENOUS | Status: AC
  Filled 2015-05-22: qty 50

## 2015-05-22 MED ORDER — SODIUM CHLORIDE 0.9% FLUSH: 3.0000 mL | INTRAVENOUS | Status: DC | PRN

## 2015-05-22 MED ORDER — LIDOCAINE-EPINEPHRINE (PF) 2 %-1:200000 IJ SOLN
INTRAMUSCULAR | Status: DC | PRN
  Administered 2015-05-22: 3 mL

## 2015-05-22 MED ORDER — WITCH HAZEL-GLYCERIN EX PADS: 1.0000 "application " | MEDICATED_PAD | CUTANEOUS | Status: DC | PRN

## 2015-05-22 MED ORDER — OXYTOCIN 10 UNIT/ML IJ SOLN
INTRAMUSCULAR | Status: AC
  Filled 2015-05-22: qty 4

## 2015-05-22 MED ORDER — TETANUS-DIPHTH-ACELL PERTUSSIS 5-2.5-18.5 LF-MCG/0.5 IM SUSP: 0.5000 mL | Freq: Once | INTRAMUSCULAR | Status: DC

## 2015-05-22 MED ORDER — MENTHOL 3 MG MT LOZG: 1.0000 | LOZENGE | OROMUCOSAL | Status: DC | PRN

## 2015-05-22 MED ORDER — BUPIVACAINE IN DEXTROSE 0.75-8.25 % IT SOLN
INTRATHECAL | Status: AC
  Filled 2015-05-22: qty 4

## 2015-05-22 MED ORDER — BUPIVACAINE IN DEXTROSE 0.75-8.25 % IT SOLN
INTRATHECAL | Status: DC | PRN
  Administered 2015-05-22: 11.5 mg via INTRATHECAL

## 2015-05-22 MED ORDER — NALBUPHINE HCL 10 MG/ML IJ SOLN
5.0000 mg | Freq: Once | INTRAMUSCULAR | Status: AC | PRN
  Administered 2015-05-22: 5 mg via SUBCUTANEOUS

## 2015-05-22 MED ORDER — ONDANSETRON HCL 4 MG/2ML IJ SOLN
INTRAMUSCULAR | Status: AC
  Filled 2015-05-22: qty 2

## 2015-05-22 MED ORDER — COCONUT OIL OIL
1.0000 "application " | TOPICAL_OIL | Status: DC | PRN
  Administered 2015-05-23: 1 via TOPICAL
  Filled 2015-05-22: qty 120

## 2015-05-22 MED ORDER — LACTATED RINGERS IV SOLN
INTRAVENOUS | Status: DC
  Administered 2015-05-23: 01:00:00 via INTRAVENOUS

## 2015-05-22 MED ORDER — LACTATED RINGERS IV SOLN
INTRAVENOUS | Status: DC
Start: 2015-05-22 — End: 2015-05-22
  Administered 2015-05-22 (×2): via INTRAVENOUS

## 2015-05-22 MED ORDER — ONDANSETRON HCL 4 MG/2ML IJ SOLN
INTRAMUSCULAR | Status: DC | PRN
  Administered 2015-05-22: 4 mg via INTRAVENOUS

## 2015-05-22 MED ORDER — PRENATAL MULTIVITAMIN CH
1.0000 | ORAL_TABLET | Freq: Every day | ORAL | Status: DC
  Administered 2015-05-23 – 2015-05-24 (×2): 1 via ORAL
  Filled 2015-05-22 (×2): qty 1

## 2015-05-22 MED ORDER — LACTATED RINGERS IV SOLN
Freq: Once | INTRAVENOUS | Status: AC
Start: 2015-05-22 — End: 2015-05-22
  Administered 2015-05-22: 10:00:00 via INTRAVENOUS

## 2015-05-22 MED ORDER — PHENYLEPHRINE HCL 10 MG/ML IJ SOLN
INTRAMUSCULAR | Status: DC | PRN
  Administered 2015-05-22: 80 ug via INTRAVENOUS

## 2015-05-22 MED ORDER — NALBUPHINE HCL 10 MG/ML IJ SOLN
INTRAMUSCULAR | Status: AC
  Filled 2015-05-22: qty 1

## 2015-05-22 MED ORDER — PHENYLEPHRINE 8 MG IN D5W 100 ML (0.08MG/ML) PREMIX OPTIME
INJECTION | INTRAVENOUS | Status: DC | PRN
  Administered 2015-05-22: 60 ug/min via INTRAVENOUS

## 2015-05-22 MED ORDER — LACTATED RINGERS IV SOLN
INTRAVENOUS | Status: DC | PRN
  Administered 2015-05-22: 11:00:00 via INTRAVENOUS

## 2015-05-22 MED ORDER — SIMETHICONE 80 MG PO CHEW
80.0000 mg | CHEWABLE_TABLET | Freq: Three times a day (TID) | ORAL | Status: DC
  Administered 2015-05-23 – 2015-05-25 (×6): 80 mg via ORAL
  Filled 2015-05-22 (×6): qty 1

## 2015-05-22 MED ORDER — BUPIVACAINE HCL (PF) 0.5 % IJ SOLN
INTRAMUSCULAR | Status: DC | PRN
  Administered 2015-05-22: 30 mL

## 2015-05-22 MED ORDER — SENNOSIDES-DOCUSATE SODIUM 8.6-50 MG PO TABS
2.0000 | ORAL_TABLET | ORAL | Status: DC
  Administered 2015-05-23 – 2015-05-24 (×3): 2 via ORAL
  Filled 2015-05-22 (×3): qty 2

## 2015-05-22 SURGICAL SUPPLY — 40 items
BARRIER ADHS 3X4 INTERCEED (GAUZE/BANDAGES/DRESSINGS) IMPLANT
BENZOIN TINCTURE PRP APPL 2/3 (GAUZE/BANDAGES/DRESSINGS) ×3 IMPLANT
CHLORAPREP W/TINT 26ML (MISCELLANEOUS) ×3 IMPLANT
CLAMP CORD UMBIL (MISCELLANEOUS) IMPLANT
CLIP FILSHIE TUBAL LIGA STRL (Clip) ×6 IMPLANT
CLOSURE STERI-STRIP 1/2X4 (GAUZE/BANDAGES/DRESSINGS) ×1
CLOSURE WOUND 1/2 X4 (GAUZE/BANDAGES/DRESSINGS) ×1
CLOTH BEACON ORANGE TIMEOUT ST (SAFETY) ×3 IMPLANT
CLSR STERI-STRIP ANTIMIC 1/2X4 (GAUZE/BANDAGES/DRESSINGS) ×2 IMPLANT
DECANTER SPIKE VIAL GLASS SM (MISCELLANEOUS) ×3 IMPLANT
DRSG OPSITE POSTOP 4X10 (GAUZE/BANDAGES/DRESSINGS) ×3 IMPLANT
ELECT REM PT RETURN 9FT ADLT (ELECTROSURGICAL) ×3
ELECTRODE REM PT RTRN 9FT ADLT (ELECTROSURGICAL) ×1 IMPLANT
EXTRACTOR VACUUM KIWI (MISCELLANEOUS) ×3 IMPLANT
GLOVE BIO SURGEON STRL SZ 6.5 (GLOVE) ×2 IMPLANT
GLOVE BIO SURGEONS STRL SZ 6.5 (GLOVE) ×1
GLOVE BIOGEL PI IND STRL 7.0 (GLOVE) ×2 IMPLANT
GLOVE BIOGEL PI INDICATOR 7.0 (GLOVE) ×4
GOWN STRL REUS W/TWL LRG LVL3 (GOWN DISPOSABLE) ×6 IMPLANT
KIT ABG SYR 3ML LUER SLIP (SYRINGE) IMPLANT
NEEDLE HYPO 22GX1.5 SAFETY (NEEDLE) IMPLANT
NEEDLE HYPO 25X5/8 SAFETYGLIDE (NEEDLE) IMPLANT
NS IRRIG 1000ML POUR BTL (IV SOLUTION) ×3 IMPLANT
PACK C SECTION WH (CUSTOM PROCEDURE TRAY) ×3 IMPLANT
PAD ABD 7.5X8 STRL (GAUZE/BANDAGES/DRESSINGS) ×3 IMPLANT
PAD OB MATERNITY 4.3X12.25 (PERSONAL CARE ITEMS) ×3 IMPLANT
PENCIL SMOKE EVAC W/HOLSTER (ELECTROSURGICAL) ×3 IMPLANT
RETRACTOR TRAXI PANNICULUS (MISCELLANEOUS) ×1 IMPLANT
RTRCTR C-SECT PINK 25CM LRG (MISCELLANEOUS) IMPLANT
SPONGE GAUZE 4X4 12PLY STER LF (GAUZE/BANDAGES/DRESSINGS) ×6 IMPLANT
STRIP CLOSURE SKIN 1/2X4 (GAUZE/BANDAGES/DRESSINGS) ×2 IMPLANT
SUT VIC AB 0 CT1 36 (SUTURE) ×18 IMPLANT
SUT VIC AB 2-0 CT1 27 (SUTURE) ×2
SUT VIC AB 2-0 CT1 TAPERPNT 27 (SUTURE) ×1 IMPLANT
SUT VIC AB 4-0 KS 27 (SUTURE) ×3 IMPLANT
SUT VIC AB 4-0 PS2 27 (SUTURE) ×3 IMPLANT
SYR CONTROL 10ML LL (SYRINGE) IMPLANT
TOWEL OR 17X24 6PK STRL BLUE (TOWEL DISPOSABLE) ×3 IMPLANT
TRAXI PANNICULUS RETRACTOR (MISCELLANEOUS) ×2
TRAY FOLEY CATH SILVER 14FR (SET/KITS/TRAYS/PACK) IMPLANT

## 2015-05-22 NOTE — Lactation Note (Signed)
This note was copied from a baby's chart. Lactation Consultation Note  Patient Name: Eileen Holmes: 05/22/2015 Reason for consult: Initial assessment Baby at 5 hr of life. Mom reports that she does not have milk yet so the baby needed some formula. She stated that she gives all her babies formula in the first [redacted] weeks along with bf. She also reports having cracked and bleeding nipples in the first 2 wk with all her babies but then goes on to bf all of them 2 yrs. Discussed baby behavior, feeding frequency, supplementing, artificial nipples, baby belly size, voids, wt loss, breast changes, and nipple care. She stated that she can manually express, has seen colostrum bilaterally, and has a spoon in the room. Given lactation handouts. Aware of OP services and support group.    Maternal Data Has patient been taught Hand Expression?: Yes Does the patient have breastfeeding experience prior to this delivery?: Yes  Feeding Feeding Type: Bottle Fed - Formula Nipple Type: Slow - flow  LATCH Score/Interventions                      Lactation Tools Discussed/Used WIC Program: No   Consult Status Consult Status: Follow-up Holmes: 05/23/15 Follow-up type: In-patient    Rulon Eisenmengerlizabeth E Chinonso Linker 05/22/2015, 4:26 PM

## 2015-05-22 NOTE — Transfer of Care (Signed)
Immediate Anesthesia Transfer of Care Note  Patient: Eileen KlinefelterNeven Gange  Procedure(s) Performed: Procedure(s): CESAREAN SECTION WITH BILATERAL TUBAL LIGATION (Bilateral)  Patient Location: PACU  Anesthesia Type:Epidural  Level of Consciousness: awake, alert , oriented and patient cooperative  Airway & Oxygen Therapy: Patient Spontanous Breathing  Post-op Assessment: Report given to RN and Post -op Vital signs reviewed and stable  Post vital signs: Reviewed and stable  Last Vitals:  Filed Vitals:   05/22/15 0955  BP: 139/78  Pulse: 73  Temp: 36.8 C  Resp: 18    Complications: No apparent anesthesia complications

## 2015-05-22 NOTE — Op Note (Signed)
Cesarean Section Operative Report  Eileen Holmes  05/22/2015  Indications: Scheduled Proceedure/Maternal Request   Pre-operative Diagnosis:  REPEAT c-section x'5   undesired fertility. Bilateral tubal ligation  Post-operative Diagnosis: Same   Surgeon: Surgeon(s) and Role:    * Adam PhenixJames G Arnold, MD - Primary    * Kathrynn RunningNoah Bedford Saleena Tamas, MD - Assisting   Attending Attestation: I was present and scrubbed for the entire procedure.   Anesthesia: epidural, spinal    Estimated Blood Loss: 800 ml  Total IV Fluids: 2700 ml LR  Urine Output:: 100 ml clear yellow urine  Specimens: none  Findings: Viable female infant in cephalic presentation; Apgars 9/9; weight 3630 g; arterial cord pH not obtained; clear amniotic fluid; intact placenta with three vessel cord; normal uterus, fallopian tubes and ovaries bilaterally. Adhesive disease bilateral lateral sides of uterus, no significant adhesive disease to lower uterine segment.  Baby condition / location:  Couplet care / Skin to Skin   Complications: no complications  Indications: Eileen Klinefeltereven Speyer is a 35 y.o. [id number] with an IUP 5072w0d presenting for repeat cesarean section.  The risks, benefits, complications, treatment options, and expected outcomes were discussed with the patient . The patient concurred with the proposed plan, giving informed consent. identified as Eileen KlinefelterNeven Skalski and the procedure verified as C-Section Delivery.  Procedure Details:  The patient was taken back to the operative suite where spinal/epidural combined anesthesia was placed.  A time out was held and the above information confirmed.   After induction of anesthesia, the patient was draped and prepped in the usual sterile manner and placed in a dorsal supine position with a leftward tilt. A Pfannenstiel incision was made and carried down through the subcutaneous tissue to the fascia. Fascial incision was made and sharply extended transversely. The fascia was separated  from the underlying rectus tissue superiorly and inferiorly. The peritoneum was identified and sharply entered and extended longitudinally. Alexis retractor was placed. Bladder flap created. A low transverse uterine incision was made and extended bluntly. Amniotic fluid collected for donation. Delivered from cephalic presentation was a viable infant with Apgars and weight as above. The umbilical cord was clamped and cut cord blood was obtained for evaluation. Cord ph was not sent. The placenta was removed Intact after avulsion of cord and appeared normal. The uterine outline, tubes and ovaries appeared normal save for adhesive disease overlying the adnexa. The Fallopian tubes were identified bilaterally.  A Filshie clip was placed on each tube without difficulty 3 cm from the cornua.  There was no bleeding. The uterine incision was closed with running locked sutures of 0Vicryl without an imbricating layer.   Hemostasis was observed. The peritoneum was closed with 0 vicryl. The rectus muscles were examined and hemostasis observed. The fascia was then reapproximated with running sutures of 0Vicryl. A total of 30 ml 0.5% Marcaine was injected subcutaneously at the margins of the incision. No subcuticular closure. The skin was closed with 4-0Vicryl.   Instrument, sponge, and needle counts were correct prior the abdominal closure and were correct at the conclusion of the case.     Disposition: PACU - hemodynamically stable.   Maternal Condition: stable       Signed: Lavonne Chickoah B WoukMD 05/22/2015 11:35 AM

## 2015-05-22 NOTE — Anesthesia Postprocedure Evaluation (Signed)
Anesthesia Post Note  Patient: Environmental consultanteven Bail  Procedure(s) Performed: Procedure(s) (LRB): CESAREAN SECTION WITH BILATERAL TUBAL LIGATION (Bilateral)  Patient location during evaluation: PACU Anesthesia Type: Combined Spinal/Epidural Level of consciousness: awake and awake and alert Pain management: pain level controlled Vital Signs Assessment: post-procedure vital signs reviewed and stable Respiratory status: spontaneous breathing, nonlabored ventilation and respiratory function stable Cardiovascular status: blood pressure returned to baseline and stable Postop Assessment: no headache, no backache, spinal receding, patient able to bend at knees and no signs of nausea or vomiting Anesthetic complications: no    Last Vitals:  Filed Vitals:   05/22/15 1239 05/22/15 1240  BP:    Pulse: 52 56  Temp:    Resp: 17 20    Last Pain:  Filed Vitals:   05/22/15 1248  PainSc: 0-No pain                 Cerra Eisenhower A.

## 2015-05-22 NOTE — Anesthesia Preprocedure Evaluation (Addendum)
Anesthesia Evaluation  Patient identified by MRN, date of birth, ID band Patient awake    Reviewed: Allergy & Precautions, NPO status , Patient's Chart, lab work & pertinent test results  Airway Mallampati: III  TM Distance: >3 FB Neck ROM: Full    Dental no notable dental hx. (+) Teeth Intact   Pulmonary neg pulmonary ROS,    Pulmonary exam normal breath sounds clear to auscultation       Cardiovascular negative cardio ROS Normal cardiovascular exam Rhythm:Regular Rate:Normal     Neuro/Psych negative neurological ROS  negative psych ROS   GI/Hepatic Neg liver ROS, GERD  Medicated and Controlled,  Endo/Other  Obesity  Renal/GU negative Renal ROS  negative genitourinary   Musculoskeletal negative musculoskeletal ROS (+)   Abdominal (+) + obese,   Peds  Hematology  (+) anemia ,   Anesthesia Other Findings   Reproductive/Obstetrics (+) Pregnancy Previous C/section x 4 Desires Sterilization                            Anesthesia Physical Anesthesia Plan  ASA: II  Anesthesia Plan: Combined Spinal and Epidural   Post-op Pain Management:    Induction:   Airway Management Planned: Natural Airway  Additional Equipment:   Intra-op Plan:   Post-operative Plan:   Informed Consent: I have reviewed the patients History and Physical, chart, labs and discussed the procedure including the risks, benefits and alternatives for the proposed anesthesia with the patient or authorized representative who has indicated his/her understanding and acceptance.     Plan Discussed with: Anesthesiologist, CRNA and Surgeon  Anesthesia Plan Comments:         Anesthesia Quick Evaluation

## 2015-05-22 NOTE — Anesthesia Postprocedure Evaluation (Signed)
Anesthesia Post Note  Patient: Eileen Holmes  Procedure(s) Performed: Procedure(s) (LRB): CESAREAN SECTION WITH BILATERAL TUBAL LIGATION (Bilateral)  Patient location during evaluation: Mother Baby Anesthesia Type: Spinal Level of consciousness: awake, awake and alert, oriented and patient cooperative Pain management: pain level controlled Vital Signs Assessment: post-procedure vital signs reviewed and stable Respiratory status: spontaneous breathing, nonlabored ventilation and respiratory function stable Cardiovascular status: stable Postop Assessment: no headache, no backache, patient able to bend at knees and no signs of nausea or vomiting Anesthetic complications: no    Last Vitals:  Filed Vitals:   05/22/15 1400 05/22/15 1500  BP: 121/58 116/62  Pulse: 54 56  Temp: 36.7 C 36.9 C  Resp: 20 16    Last Pain:  Filed Vitals:   05/22/15 1527  PainSc: 2                  Noni Stonesifer L

## 2015-05-22 NOTE — Addendum Note (Signed)
Addendum  created 05/22/15 1702 by Yolonda KidaAlison L Etta Gassett, CRNA   Modules edited: Clinical Notes   Clinical Notes:  File: [id number]

## 2015-05-22 NOTE — Anesthesia Procedure Notes (Signed)
Spinal Patient location during procedure: OR Start time: 05/22/2015 10:28 AM Staffing Anesthesiologist: Mal AmabileFOSTER, Teofilo Lupinacci Performed by: anesthesiologist  Preanesthetic Checklist Completed: patient identified, site marked, surgical consent, pre-op evaluation, timeout performed, IV checked, risks and benefits discussed and monitors and equipment checked Spinal Block Patient position: sitting Prep: site prepped and draped and DuraPrep Patient monitoring: cardiac monitor, continuous pulse ox, blood pressure and heart rate Approach: midline Location: L4-5 Injection technique: catheter Needle Needle type: Tuohy and Sprotte  Needle gauge: 24 G Needle length: 12.7 cm Needle insertion depth: 6 cm Catheter type: closed end flexible Catheter size: 19 g Assessment Sensory level: T4 Additional Notes Epidural performed as above. SAB then performed through Epidural needle. CSF clear, free flow, no heme or paresthesias noted. LA + Narcotics injected. Spinal needle withdrawn and epidural catheter threaded 5 cm into the epidural space. Epidural needle withdrawn and sterile dressing applied. Patient placed supine with LUD. Patient tolerated procedure well. Adequate sensory level.

## 2015-05-22 NOTE — H&P (Signed)
LABOR AND DELIVERY ADMISSION HISTORY AND PHYSICAL NOTE  Eileen Holmes is a 35 y.o. female [phone] with IUP at [redacted]w[redacted]d by 14 wk u/s presenting for scheduled repeat c/s.   She reports positive fetal movement. She denies leakage of fluid or vaginal bleeding.  Prenatal History/Complications:  Past Medical History: Past Medical History  Diagnosis Date  . No pertinent past medical history   . IBS (irritable bowel syndrome)     Past Surgical History: Past Surgical History  Procedure Laterality Date  . Cesarean section    . Multiple tooth extractions      Obstetrical History: OB History    Gravida Para Term Preterm AB TAB SAB Ectopic Multiple Living   Social History: Social History   Social History  . Marital Status: Married    Spouse Name: N/A  . Number of Children: N/A  . Years of Education: N/A   Social History Main Topics  . Smoking status: Never Smoker   . Smokeless tobacco: None  . Alcohol Use: No  . Drug Use: No  . Sexual Activity: Yes   Other Topics Concern  . None   Social History Narrative    Family History: History reviewed. No pertinent family history.  Allergies: No Known Allergies  Prescriptions prior to admission  Medication Sig Dispense Refill Last Dose  . famotidine (PEPCID) 10 MG tablet Take 10 mg by mouth daily.   05/21/2015 at Unknown time  . Prenatal Vit-Fe Fumarate-FA (PRENATAL MULTIVITAMIN) TABS tablet Take 1 tablet by mouth daily at 12 noon.   05/21/2015 at Unknown time  . acetaminophen-codeine (TYLENOL #3) 300-30 MG tablet Take by mouth every 4 (four) hours as needed for moderate pain. Reported on 05/07/2015   Unknown at Unknown time  . dicyclomine (BENTYL) 20 MG tablet Take 1 tablet (20 mg total) by mouth 2 (two) times daily as needed for spasms. (Patient not taking: Reported on 01/01/2015) 20 tablet 0 More than a month at Unknown time  . metoCLOPramide (REGLAN) 10 MG tablet Take 1 tablet (10 mg total) by mouth every 6  (six) hours as needed for nausea. (Patient not taking: Reported on 01/01/2015) 30 tablet 0 Not Taking  . promethazine (PHENERGAN) 25 MG tablet Take 1 tablet (25 mg total) by mouth every 6 (six) hours as needed for nausea or vomiting. (Patient not taking: Reported on 01/01/2015) 30 tablet 2 Not Taking     Review of Systems   All systems reviewed and negative except as stated in HPI  Blood pressure 139/78, pulse 73, temperature 98.3 F (36.8 C), temperature source Oral, resp. rate 18, last menstrual period 08/27/2014, SpO2 100 %, unknown if currently breastfeeding. General appearance: alert, cooperative and appears stated age Lungs: clear to auscultation bilaterally Heart: regular rate and rhythm Abdomen: soft, non-tender; bowel sounds normal Extremities: No calf swelling or tenderness      Prenatal labs: ABO, Rh: --/--/AB POS, AB POS (04/18 1200) Antibody: NEG (04/18 1200) Rubella: !Error!imm RPR: Non Reactive (04/18 1200)  HBsAg: Negative (10/20 0000)  HIV: Non-reactive (10/20 0000)  GBS: Negative (03/30 0000)  1 hr Glucola: 153, 3 hr 82/151/119/109 Genetic screening:  Nips wnl Anatomy US: wnl3  Prenatal Transfer Tool  Maternal Diabetes: No Genetic Screening: Normal Maternal Ultrasounds/Referrals: Normal Fetal Ultrasounds or other Referrals:  None Maternal Substance Abuse:  No Significant Maternal Medications:  none Significant Maternal Lab Results: Lab values include: Group B Strep negative  Results for orders placed or performed during the hospital encounter of 05/22/15 (from the past 24 hour(s))  Prepare RBC (crossmatch)   Collection Time: 05/22/15  9:28 AM  Result Value Ref Range   Order Confirmation ORDER PROCESSED BY BLOOD BANK   Results for orders placed or performed during the hospital encounter of 05/21/15 (from the past 24 hour(s))  CBC   Collection Time: 05/21/15 12:00 PM  Result Value Ref Range   WBC 9.7 4.0 - 10.5 K/uL   RBC 3.53 (L) 3.87 - 5.11 MIL/uL    Hemoglobin 9.1 (L) 12.0 - 15.0 g/dL   HCT 16.128.7 (L) 09.636.0 - 04.546.0 %   MCV 81.3 78.0 - 100.0 fL   MCH 25.8 (L) 26.0 - 34.0 pg   MCHC 31.7 30.0 - 36.0 g/dL   RDW 40.916.3 (H) 81.111.5 - 91.415.5 %   Platelets 199 150 - 400 K/uL  RPR   Collection Time: 05/21/15 12:00 PM  Result Value Ref Range   RPR Ser Ql Non Reactive Non Reactive  Type and screen   Collection Time: 05/21/15 12:00 PM  Result Value Ref Range   ABO/RH(D) AB POS    Antibody Screen NEG    Sample Expiration 05/24/2015    Unit Number [id number]    Blood Component Type RED CELLS,LR    Unit division 00    Status of Unit ALLOCATED    Transfusion Status OK TO TRANSFUSE    Crossmatch Result Compatible    Unit Number [id number]    Blood Component Type RED CELLS,LR    Unit division 00    Status of Unit ALLOCATED    Transfusion Status OK TO TRANSFUSE    Crossmatch Result Compatible   ABO/Rh   Collection Time: 05/21/15 12:00 PM  Result Value Ref Range   ABO/RH(D) AB POS     Patient Active Problem List   Diagnosis Date Noted  . Advanced maternal age in multigravida 11/29/2014  . [redacted] weeks gestation of pregnancy     Assessment: Eileen Holmes is a 35 y.o. [id number] at 8058w0d here for scheduled repeat c/s  #Labor: c/s today. The risks of cesarean section were discussed with the patient including but were not limited to: bleeding which may require transfusion or reoperation; infection which may require antibiotics; injury to bowel, bladder, ureters or other surrounding organs; injury to the fetus; need for additional procedures including hysterectomy in the event of a life-threatening hemorrhage; placental abnormalities wth subsequent pregnancies, incisional problems, thromboembolic phenomenon and other postoperative/anesthesia complications.  Patient also desires permanent sterilization.  Other reversible forms of contraception were discussed with patient; she declines all other modalities. Risks of procedure discussed with patient  including but not limited to: risk of regret, permanence of method, bleeding, infection, injury to surrounding organs and need for additional procedures.  Failure risk of 1-2% with increased risk of ectopic gestation if pregnancy occurs was also discussed with patient.  The patient concurred with the proposed plan, giving informed written consent for the procedures.   #Pain: Spinal/epidural #ID:  gbs neg #MOF: breast #MOC: btl #Circ:  n/a  Eileen Holmes 05/22/2015, 10:07 AM

## 2015-05-23 ENCOUNTER — Encounter (HOSPITAL_COMMUNITY): Payer: Self-pay | Admitting: Student

## 2015-05-23 LAB — CBC
HEMATOCRIT: 25 % — AB (ref 36.0–46.0)
HEMOGLOBIN: 8 g/dL — AB (ref 12.0–15.0)
MCH: 26.1 pg (ref 26.0–34.0)
MCHC: 32 g/dL (ref 30.0–36.0)
MCV: 81.4 fL (ref 78.0–100.0)
Platelets: 156 10*3/uL (ref 150–400)
RBC: 3.07 MIL/uL — ABNORMAL LOW (ref 3.87–5.11)
RDW: 16.5 % — AB (ref 11.5–15.5)
WBC: 9 10*3/uL (ref 4.0–10.5)

## 2015-05-23 NOTE — Progress Notes (Signed)
Post Op Day 1 Subjective:  Eileen Holmes is a 35 y.o. [id number] 2932w0d s/p RLTCS/BTL.  No acute events overnight.  Pt denies problems with voiding or po intake.  She has not yet ambulated but is waiting for nurses to come help her get up this morning.  She denies nausea or vomiting.  Pain is moderately controlled.  She has not had flatus. She has not had bowel movement.  Lochia Minimal.  Plan for birth control is bilateral tubal ligation.  Method of Feeding: breast and bottle.  Patient hemoglobin is down to 8 today but she has not had symptoms.  Objective: Blood pressure 119/36, pulse 53, temperature 98.3 F (36.8 C), temperature source Oral, resp. rate 12, last menstrual period 08/27/2014, SpO2 97 %, unknown if currently breastfeeding.  Physical Exam:  General: alert, cooperative and no distress Lochia:normal flow Chest: CTAB Heart: RRR no m/r/g Abdomen: +BS, soft, moderately tender Uterine Fundus: firm, at level of umbilicus DVT Evaluation: Homans sign negative. DVT prophylaxis on. Extremities: No edema   Recent Labs  05/21/15 1200 05/23/15 0513  HGB 9.1* 8.0*  HCT 28.7* 25.0*    Assessment/Plan:  ASSESSMENT: Eileen Holmes is a 35 y.o. [id number] 3332w0d s/p RLTCS/BTL.  Pt will stay today and we will continue to monitor ambulation, ins and outs, pain control and hemoglobin/hematocrit.  Plan for discharge tomorrow   LOS: 1 day   Jonni SangerSara Feldman 05/23/2015, 8:39 AM   CNM attestation Post Partum Day #1  Eileen Holmes is a 35 y.o. [id number] s/p rLTCS/BTL.  Pt denies problems with ambulating, voiding or po intake. Pain is well controlled.  Plan for birth control is bilateral tubal ligation.  Method of Feeding: both  PE:  BP 130/57 mmHg  Pulse 52  Temp(Src) 98.2 F (36.8 C) (Oral)  Resp 18  SpO2 99%  LMP 08/27/2014 (Approximate)  Breastfeeding? Unknown Fundus firm Dsg: intact/clean  Hgb: 8.0 (9.1)  Plan for discharge: 05/24/15  Cam HaiSHAW, Rondall Radigan, CNM 9:12 AM

## 2015-05-24 NOTE — Progress Notes (Addendum)
Post Op Day 2 Subjective:  Meda Klinefeltereven Bencomo is a 35 y.o. [id number] 7122w0d s/p RLTCS/BTL.  No acute events overnight.  Pt denies problems with ambulating, voiding or po intake.  She denies nausea or vomiting.  Pain is moderately controlled with the pain medication.  She has had flatus. She has had bowel movement.  Lochia Minimal.  Plan for birth control is bilateral tubal ligation.  Method of Feeding: Breast and bottle.  Patient reports pain with breastfeeding due to nipple discomfort and cracking.  She reports that the coconut oil and silicone patches have not helped.  She would like to see lactation today.  Patient would like to stay until tomorrow for pain control and a more relaxing environment, due to having 4 other children at home.  Objective: Blood pressure 128/73, pulse 51, temperature 98.2 F (36.8 C), temperature source Oral, resp. rate 18, last menstrual period 08/27/2014, SpO2 99 %, unknown if currently breastfeeding.  Physical Exam:  General: alert, cooperative and no distress Lochia:normal flow Chest: CTAB Heart: RRR no m/r/g Abdomen: +BS, soft, tender to palpation Uterine Fundus: firm, around level of umbilicus DVT Evaluation: No evidence of DVT seen on physical exam.  Pt is ambulating well Extremities: No edema   Recent Labs  05/21/15 1200 05/23/15 0513  HGB 9.1* 8.0*  HCT 28.7* 25.0*    Assessment/Plan:  ASSESSMENT: Meda Klinefeltereven Spallone is a 35 y.o. [id number] 6322w0d s/p RLTCS/BTL.  Patient has had no problems with voiding, stooling, po or ambulation.  Patient reports moderate pain and is having difficulties breastfeeding due to discomfort.  Lactation will see patient today before she leaves tomorrow.  Plan for discharge tomorrow   LOS: 2 days   Jonni SangerSara Feldman 05/24/2015, 7:45 AM   CNM attestation Post Partum Day #2   Meda Klinefeltereven Guillermo is a 35 y.o. [id number] s/p rLTCS/BTL.  Pt denies problems with ambulating, voiding or po intake. Pain is well controlled.  Plan for birth control  is bilateral tubal ligation.  Method of Feeding: breast/bottle  PE:  BP 130/57 mmHg  Pulse 52  Temp(Src) 98.2 F (36.8 C) (Oral)  Resp 18  SpO2 99%  LMP 08/27/2014 (Approximate)  Breastfeeding? Unknown Fundus firm Dsg intact/clean  Plan for discharge: 05/25/15  Cam HaiSHAW, Teresita Fanton, CNM 9:09 AM

## 2015-05-25 LAB — TYPE AND SCREEN
ABO/RH(D): AB POS
ANTIBODY SCREEN: NEGATIVE
UNIT DIVISION: 0
Unit division: 0

## 2015-05-25 MED ORDER — IBUPROFEN 600 MG PO TABS: 600.0000 mg | ORAL_TABLET | Freq: Four times a day (QID) | ORAL | Status: DC

## 2015-05-25 MED ORDER — OXYCODONE HCL 5 MG PO TABS: 5.0000 mg | ORAL_TABLET | ORAL | Status: DC | PRN

## 2015-05-25 NOTE — Discharge Summary (Signed)
OB Discharge Summary  Patient Name: Eileen Holmes DOB: 1980-06-20 MRN: [id number]  Date of admission: 05/22/2015 Delivering MD: Adam PhenixARNOLD, JAMES G   Date of discharge: 05/25/2015  Admitting diagnosis: cpt [id number] - REPEAT c-section x'5  cpt 58611 -  undesired fertility Intrauterine pregnancy: 832w0d     Secondary diagnosis:Active Problems:   Status post repeat low transverse cesarean section  Additional problems: None    Discharge diagnosis: Term Pregnancy Delivered, Gestational Hypertension and Anemia                                                                     Post partum procedures:postpartum tubal ligation  Augmentation: n/a  Complications: None  Hospital course:  Sceduled C/S   35 y.o. yo [id number] at 522w0d was admitted to the hospital 05/22/2015 for scheduled cesarean section with the following indication:Elective Repeat.  Membrane Rupture Time/Date: 10:58 AM ,05/22/2015   Patient delivered a Viable infant.05/22/2015  Details of operation can be found in separate operative note.  Pateint had an uncomplicated postpartum course.  She is ambulating, tolerating a regular diet, passing flatus, and urinating well. Patient is discharged home in stable condition on  05/25/2015          Physical exam  Filed Vitals:   05/23/15 1700 05/24/15 0556 05/24/15 1700 05/25/15 0500  BP: 123/75 128/73 126/56 130/57  Pulse: 59 51 53 52  Temp: 98.3 F (36.8 C) 98.2 F (36.8 C) 98.4 F (36.9 C) 98.2 F (36.8 C)  TempSrc: Oral Oral Oral Oral  Resp: 17 18 18    SpO2:  99%     General: alert, cooperative and no distress Lochia: appropriate Uterine Fundus: firm Incision: Moderate amount of dried dark blood present on honeycomb dressing DVT Evaluation: No evidence of DVT seen on physical exam. Labs: Lab Results  Component Value Date   WBC 9.0 05/23/2015   HGB 8.0* 05/23/2015   HCT 25.0* 05/23/2015   MCV 81.4 05/23/2015   PLT 156 05/23/2015   No flowsheet data  found.  Discharge instruction: per After Visit Summary and "Baby and Me Booklet".  After Visit Meds:    Medication List    STOP taking these medications        dicyclomine 20 MG tablet  Commonly known as:  BENTYL     metoCLOPramide 10 MG tablet  Commonly known as:  REGLAN     promethazine 25 MG tablet  Commonly known as:  PHENERGAN      TAKE these medications        acetaminophen-codeine 300-30 MG tablet  Commonly known as:  TYLENOL #3  Take by mouth every 4 (four) hours as needed for moderate pain. Reported on 05/07/2015     famotidine 10 MG tablet  Commonly known as:  PEPCID  Take 10 mg by mouth daily.     ibuprofen 600 MG tablet  Commonly known as:  ADVIL,MOTRIN  Take 1 tablet (600 mg total) by mouth every 6 (six) hours.     oxyCODONE 5 MG immediate release tablet  Commonly known as:  Oxy IR/ROXICODONE  Take 1 tablet (5 mg total) by mouth every 4 (four) hours as needed (pain scale 4-7).     prenatal multivitamin Tabs tablet  Take  1 tablet by mouth daily at 12 noon.        Diet: routine diet  Activity: Advance as tolerated. Pelvic rest for 6 weeks.   Outpatient follow up: 1 week for BP check, 6 weeks for postpartum appointment Follow up Appt:No future appointments. Follow up visit: No Follow-up on file.  Postpartum contraception: Tubal Ligation  Newborn Data: Live born female  Birth Weight: 8 lb (3630 g) APGAR: 9, 9  Baby Feeding: Breast Disposition:home with mother   05/25/2015 Eileen Sinclair, MD  CNM attestation I have seen and examined this patient and agree with above documentation in the resident's note.   Eileen Holmes is a 35 y.o. [id number] s/p rLTCS/BTL.  Pain is well controlled.  Plan for birth control is s/p bilateral tubal ligation.  Method of Feeding: breast  PE:  BP 130/57 mmHg  Pulse 52  Temp(Src) 98.2 F (36.8 C) (Oral)  Resp 18  SpO2 99%  LMP 08/27/2014 (Approximate)  Breastfeeding? Unknown Fundus firm   Recent Labs   05/23/15 0513  HGB 8.0*  HCT 25.0*     Plan: discharge today - postpartum care discussed - f/u clinic in 6 weeks for postpartum visit   Cam Hai, CNM 9:01 AM  05/25/2015

## 2015-05-25 NOTE — Discharge Instructions (Signed)
Postpartum Care After Cesarean Delivery °After you deliver your newborn (postpartum period), the usual stay in the hospital is 24-72 hours. If there were problems with your labor or delivery, or if you have other medical problems, you might be in the hospital longer.  °While you are in the hospital, you will receive help and instructions on how to care for yourself and your newborn during the postpartum period.  °While you are in the hospital: °· It is normal for you to have pain or discomfort from the incision in your abdomen. Be sure to tell your nurses when you are having pain, where the pain is located, and what makes the pain worse. °· If you are breastfeeding, you may feel uncomfortable contractions of your uterus for a couple of weeks. This is normal. The contractions help your uterus get back to normal size. °· It is normal to have some bleeding after delivery. °· For the first 1-3 days after delivery, the flow is red and the amount may be similar to a period. °· It is common for the flow to start and stop. °· In the first few days, you may pass some small clots. Let your nurses know if you begin to pass large clots or your flow increases. °· Do not  flush blood clots down the toilet before having the nurse look at them. °· During the next 3-10 days after delivery, your flow should become more watery and pink or brown-tinged in color. °· Ten to fourteen days after delivery, your flow should be a small amount of yellowish-white discharge. °· The amount of your flow will decrease over the first few weeks after delivery. Your flow may stop in 6-8 weeks. Most women have had their flow stop by 12 weeks after delivery. °· You should change your sanitary pads frequently. °· Wash your hands thoroughly with soap and water for at least 20 seconds after changing pads, using the toilet, or before holding or feeding your newborn. °· Your intravenous (IV) tubing will be removed when you are drinking enough fluids. °· The  urine drainage tube (urinary catheter) that was inserted before delivery may be removed within 6-8 hours after delivery or when feeling returns to your legs. You should feel like you need to empty your bladder within the first 6-8 hours after the catheter has been removed. °· In case you become weak, lightheaded, or faint, call your nurse before you get out of bed for the first time and before you take a shower for the first time. °· Within the first few days after delivery, your breasts may begin to feel tender and full. This is called engorgement. Breast tenderness usually goes away within 48-72 hours after engorgement occurs. You may also notice milk leaking from your breasts. If you are not breastfeeding, do not stimulate your breasts. Breast stimulation can make your breasts produce more milk. °· Spending as much time as possible with your newborn is very important. During this time, you and your newborn can feel close and get to know each other. Having your newborn stay in your room (rooming in) will help to strengthen the bond with your newborn. It will give you time to get to know your newborn and become comfortable caring for your newborn. °· Your hormones change after delivery. Sometimes the hormone changes can temporarily cause you to feel sad or tearful. These feelings should not last more than a few days. If these feelings last longer than that, you should talk to your   caregiver. °· If desired, talk to your caregiver about methods of family planning or contraception. °· Talk to your caregiver about immunizations. Your caregiver may want you to have the following immunizations before leaving the hospital: °· Tetanus, diphtheria, and pertussis (Tdap) or tetanus and diphtheria (Td) immunization. It is very important that you and your family (including grandparents) or others caring for your newborn are up-to-date with the Tdap or Td immunizations. The Tdap or Td immunization can help protect your newborn  from getting ill. °· Rubella immunization. °· Varicella (chickenpox) immunization. °· Influenza immunization. You should receive this annual immunization if you did not receive the immunization during your pregnancy. °  °This information is not intended to replace advice given to you by your health care provider. Make sure you discuss any questions you have with your health care provider. °  °Document Released: 10/14/2011 Document Reviewed: 10/14/2011 °Elsevier Interactive Patient Education ©2016 [employer] ° °Iron-Rich Diet °Iron is a mineral that helps your body to produce hemoglobin. Hemoglobin is a protein in your red blood cells that carries oxygen to your body's tissues. Eating too little iron may cause you to feel weak and tired, and it can increase your risk for infection. Eating enough iron is necessary for your body's metabolism, muscle function, and nervous system. °Iron is naturally found in many foods. It can also be added to foods or fortified in foods. There are two types of dietary iron: °· Heme iron. Heme iron is absorbed by the body more easily than nonheme iron. Heme iron is found in meat, poultry, and fish. °· Nonheme iron. Nonheme iron is found in dietary supplements, iron-fortified grains, beans, and vegetables. °You may need to follow an iron-rich diet if: °· You have been diagnosed with iron deficiency or iron-deficiency anemia. °· You have a condition that prevents you from absorbing dietary iron, such as: °¨ Infection in your intestines. °¨ Celiac disease. This involves long-lasting (chronic) inflammation of your intestines. °· You do not eat enough iron. °· You eat a diet that is high in foods that impair iron absorption. °· You have lost a lot of blood. °· You have heavy bleeding during your menstrual cycle. °· You are pregnant. °WHAT IS MY PLAN? °Your health care provider may help you to determine how much iron you need per day based on your condition. Generally, when a person  consumes sufficient amounts of iron in the diet, the following iron needs are met: °· Men. °¨ 14-18 years old: 11 mg per day. °¨ 19-50 years old: 8 mg per day. °· Women.   °¨ 14-18 years old: 15 mg per day. °¨ 19-50 years old: 18 mg per day. °¨ Over 50 years old: 8 mg per day. °¨ Pregnant women: 27 mg per day. °¨ Breastfeeding women: 9 mg per day. °WHAT DO I NEED TO KNOW ABOUT AN IRON-RICH DIET? °· Eat fresh fruits and vegetables that are high in vitamin C along with foods that are high in iron. This will help increase the amount of iron that your body absorbs from food, especially with foods containing nonheme iron. Foods that are high in vitamin C include oranges, peppers, tomatoes, and mango. °· Take iron supplements only as directed by your health care provider. Overdose of iron can be life-threatening. If you were prescribed iron supplements, take them with orange juice or a vitamin C supplement. °· Cook foods in pots and pans that are made from iron.   °· Eat nonheme iron-containing foods alongside foods that   are high in heme iron. This helps to improve your iron absorption.   °· Certain foods and drinks contain compounds that impair iron absorption. Avoid eating these foods in the same meal as iron-rich foods or with iron supplements. These include: °¨ Coffee, black tea, and red wine. °¨ Milk, dairy products, and foods that are high in calcium. °¨ Beans, soybeans, and peas. °¨ Whole grains. °· When eating foods that contain both nonheme iron and compounds that impair iron absorption, follow these tips to absorb iron better.   °¨ Soak beans overnight before cooking. °¨ Soak whole grains overnight and drain them before using. °¨ Ferment flours before baking, such as using yeast in bread dough. °WHAT FOODS CAN I EAT? °Grains  °Iron-fortified breakfast cereal. Iron-fortified whole-wheat bread. Enriched rice. Sprouted grains. °Vegetables  °Spinach. Potatoes with skin. Green peas. Broccoli. Red and green bell  peppers. Fermented vegetables. °Fruits  °Prunes. Raisins. Oranges. Strawberries. Mango. Grapefruit. °Meats and Other Protein Sources  °Beef liver. Oysters. Beef. Shrimp. Turkey. Chicken. Tuna. Sardines. Chickpeas. Nuts. Tofu. °Beverages  °Tomato juice. Fresh orange juice. Prune juice. Hibiscus tea. Fortified instant breakfast shakes. °Condiments  °Tahini. Fermented soy sauce.  °Sweets and Desserts  °Black-strap molasses.  °Other  °Wheat germ. °The items listed above may not be a complete list of recommended foods or beverages. Contact your dietitian for more options.  °WHAT FOODS ARE NOT RECOMMENDED? °Grains  °Whole grains. Bran cereal. Bran flour. Oats. °Vegetables  °Artichokes. Brussels sprouts. Kale. °Fruits  °Blueberries. Raspberries. Strawberries. Figs. °Meats and Other Protein Sources  °Soybeans. Products made from soy protein. °Dairy  °Milk. Cream. Cheese. Yogurt. Cottage cheese. °Beverages  °Coffee. Black tea. Red wine. °Sweets and Desserts  °Cocoa. Chocolate. Ice cream. °Other  °Basil. Oregano. Parsley. °The items listed above may not be a complete list of foods and beverages to avoid. Contact your dietitian for more information.  °  °This information is not intended to replace advice given to you by your health care provider. Make sure you discuss any questions you have with your health care provider. °  °Document Released: 09/02/2004 Document Revised: 02/09/2014 Document Reviewed: 08/16/2013 °Elsevier Interactive Patient Education ©2016 [employer] Inc. ° °

## 2015-05-29 ENCOUNTER — Inpatient Hospital Stay (HOSPITAL_COMMUNITY)
Admission: AD | Admit: 2015-05-29 | Discharge: 2015-05-29 | Disposition: A | Discharge status: Home / Self Care | Payer: Medicaid Other | Source: Ambulatory Visit | Attending: Obstetrics & Gynecology | Admitting: Obstetrics & Gynecology

## 2015-05-29 ENCOUNTER — Encounter (HOSPITAL_COMMUNITY): Payer: Self-pay

## 2015-05-29 DIAGNOSIS — R0602 Shortness of breath: Secondary | ICD-10-CM | POA: Insufficient documentation

## 2015-05-29 DIAGNOSIS — O135 Gestational [pregnancy-induced] hypertension without significant proteinuria, complicating the puerperium: Secondary | ICD-10-CM | POA: Insufficient documentation

## 2015-05-29 DIAGNOSIS — O165 Unspecified maternal hypertension, complicating the puerperium: Secondary | ICD-10-CM | POA: Diagnosis not present

## 2015-05-29 DIAGNOSIS — K589 Irritable bowel syndrome without diarrhea: Secondary | ICD-10-CM | POA: Diagnosis not present

## 2015-05-29 DIAGNOSIS — R300 Dysuria: Secondary | ICD-10-CM | POA: Diagnosis present

## 2015-05-29 DIAGNOSIS — O9213 Cracked nipple associated with lactation: Secondary | ICD-10-CM | POA: Diagnosis not present

## 2015-05-29 DIAGNOSIS — O1205 Gestational edema, complicating the puerperium: Secondary | ICD-10-CM | POA: Diagnosis not present

## 2015-05-29 DIAGNOSIS — R109 Unspecified abdominal pain: Secondary | ICD-10-CM | POA: Diagnosis not present

## 2015-05-29 DIAGNOSIS — M7989 Other specified soft tissue disorders: Secondary | ICD-10-CM | POA: Insufficient documentation

## 2015-05-29 DIAGNOSIS — O9212 Cracked nipple associated with the puerperium: Secondary | ICD-10-CM

## 2015-05-29 LAB — URINALYSIS, ROUTINE W REFLEX MICROSCOPIC
Bilirubin Urine: NEGATIVE
GLUCOSE, UA: NEGATIVE mg/dL
Hgb urine dipstick: NEGATIVE
KETONES UR: NEGATIVE mg/dL
Nitrite: NEGATIVE
PH: 5.5 (ref 5.0–8.0)
Protein, ur: NEGATIVE mg/dL
SPECIFIC GRAVITY, URINE: 1.015 (ref 1.005–1.030)

## 2015-05-29 LAB — COMPREHENSIVE METABOLIC PANEL
ALK PHOS: 112 U/L (ref 38–126)
ALT: 26 U/L (ref 14–54)
ANION GAP: 6 (ref 5–15)
AST: 28 U/L (ref 15–41)
Albumin: 2.9 g/dL — ABNORMAL LOW (ref 3.5–5.0)
BILIRUBIN TOTAL: 0.1 mg/dL — AB (ref 0.3–1.2)
BUN: 15 mg/dL (ref 6–20)
CALCIUM: 8.5 mg/dL — AB (ref 8.9–10.3)
CO2: 23 mmol/L (ref 22–32)
CREATININE: 0.53 mg/dL (ref 0.44–1.00)
Chloride: 108 mmol/L (ref 101–111)
Glucose, Bld: 114 mg/dL — ABNORMAL HIGH (ref 65–99)
Potassium: 3.7 mmol/L (ref 3.5–5.1)
SODIUM: 137 mmol/L (ref 135–145)
TOTAL PROTEIN: 6.3 g/dL — AB (ref 6.5–8.1)

## 2015-05-29 LAB — CBC
HEMATOCRIT: 28.5 % — AB (ref 36.0–46.0)
HEMOGLOBIN: 9.1 g/dL — AB (ref 12.0–15.0)
MCH: 25.9 pg — AB (ref 26.0–34.0)
MCHC: 31.9 g/dL (ref 30.0–36.0)
MCV: 81 fL (ref 78.0–100.0)
Platelets: 257 10*3/uL (ref 150–400)
RBC: 3.52 MIL/uL — ABNORMAL LOW (ref 3.87–5.11)
RDW: 16.7 % — ABNORMAL HIGH (ref 11.5–15.5)
WBC: 7 10*3/uL (ref 4.0–10.5)

## 2015-05-29 LAB — PROTEIN / CREATININE RATIO, URINE
Creatinine, Urine: 55 mg/dL
Total Protein, Urine: <6 mg/dL

## 2015-05-29 LAB — URINE MICROSCOPIC-ADD ON

## 2015-05-29 MED ORDER — OXYCODONE-ACETAMINOPHEN 5-325 MG PO TABS
2.0000 | ORAL_TABLET | Freq: Once | ORAL | Status: AC
  Administered 2015-05-29: 2 via ORAL
  Filled 2015-05-29: qty 2

## 2015-05-29 MED ORDER — HYDROCHLOROTHIAZIDE 25 MG PO TABS: 25.0000 mg | ORAL_TABLET | Freq: Every day | ORAL | Status: DC

## 2015-05-29 MED ORDER — OXYCODONE-ACETAMINOPHEN 5-325 MG PO TABS: 1.0000 | ORAL_TABLET | ORAL | Status: DC | PRN

## 2015-05-29 MED ORDER — FUROSEMIDE 20 MG PO TABS
20.0000 mg | ORAL_TABLET | Freq: Once | ORAL | Status: AC
  Administered 2015-05-29: 20 mg via ORAL
  Filled 2015-05-29: qty 1

## 2015-05-29 NOTE — Discharge Instructions (Signed)
Hypertension During Pregnancy  Hypertension, or high blood pressure, is when there is extra pressure inside your blood vessels that carry blood from the heart to the rest of your body (arteries). It can happen at any time in life, including pregnancy. Hypertension during pregnancy can cause problems for you and your baby. Your baby might not weigh as much as he or she should at birth or might be born early (premature). Very bad cases of hypertension during pregnancy can be life-threatening.   Different types of hypertension can occur during pregnancy. These include:  · Chronic hypertension. This happens when a woman has hypertension before pregnancy and it continues during pregnancy.  · Gestational hypertension. This is when hypertension develops during pregnancy.  · Preeclampsia or toxemia of pregnancy. This is a very serious type of hypertension that develops only during pregnancy. It affects the whole body and can be very dangerous for both mother and baby.    Gestational hypertension and preeclampsia usually go away after your baby is born. Your blood pressure will likely stabilize within 6 weeks. Women who have hypertension during pregnancy have a greater chance of developing hypertension later in life or with future pregnancies.  RISK FACTORS  There are certain factors that make it more likely for you to develop hypertension during pregnancy. These include:  · Having hypertension before pregnancy.  · Having hypertension during a previous pregnancy.  · Being overweight.  · Being older than 40 years.  · Being pregnant with more than one baby.  · Having diabetes or kidney problems.  SIGNS AND SYMPTOMS  Chronic and gestational hypertension rarely cause symptoms. Preeclampsia has symptoms, which may include:  · Increased protein in your urine. Your health care provider will check for this at every prenatal visit.  · Swelling of your hands and face.  · Rapid weight gain.  · Headaches.  · Visual changes.  · Being  bothered by light.  · Abdominal pain, especially in the upper right area.  · Chest pain.  · Shortness of breath.  · Increased reflexes.  · Seizures. These occur with a more severe form of preeclampsia, called eclampsia.  DIAGNOSIS   You may be diagnosed with hypertension during a regular prenatal exam. At each prenatal visit, you may have:  · Your blood pressure checked.  · A urine test to check for protein in your urine.  The type of hypertension you are diagnosed with depends on when you developed it. It also depends on your specific blood pressure reading.  · Developing hypertension before 20 weeks of pregnancy is consistent with chronic hypertension.  · Developing hypertension after 20 weeks of pregnancy is consistent with gestational hypertension.  · Hypertension with increased urinary protein is diagnosed as preeclampsia.  · Blood pressure measurements that stay above 160 systolic or 110 diastolic are a sign of severe preeclampsia.  TREATMENT  Treatment for hypertension during pregnancy varies. Treatment depends on the type of hypertension and how serious it is.  · If you take medicine for chronic hypertension, you may need to switch medicines.    Medicines called ACE inhibitors should not be taken during pregnancy.    Low-dose aspirin may be suggested for women who have risk factors for preeclampsia.  · If you have gestational hypertension, you may need to take a blood pressure medicine that is safe during pregnancy. Your health care provider will recommend the correct medicine.  · If you have severe preeclampsia, you may need to be in the hospital. Health care   done to protect you and your baby. The only cure for preeclampsia is delivery.  Your health  care provider may recommend that you take one low-dose aspirin (81 mg) each day to help prevent high blood pressure during your pregnancy if you are at risk for preeclampsia. You may be at risk for preeclampsia if:  You had preeclampsia or eclampsia during a previous pregnancy.  Your baby did not grow as expected during a previous pregnancy.  You experienced preterm birth with a previous pregnancy.  You experienced a separation of the placenta from the uterus (placental abruption) during a previous pregnancy.  You experienced the loss of your baby during a previous pregnancy.  You are pregnant with more than one baby.  You have other medical conditions, such as diabetes or an autoimmune disease. HOME CARE INSTRUCTIONS  Schedule and keep all of your regular prenatal care appointments. This is important.  Take medicines only as directed by your health care provider. Tell your health care provider about all medicines you take.  Eat as little salt as possible.  Get regular exercise.  Do not drink alcohol.  Do not use tobacco products.  Do not drink products with caffeine.  Lie on your left side when resting. SEEK IMMEDIATE MEDICAL CARE IF:  You have severe abdominal pain.  You have sudden swelling in your hands, ankles, or face.  You gain 4 pounds (1.8 kg) or more in 1 week.  You vomit repeatedly.  You have vaginal bleeding.  You do not feel your baby moving as much.  You have a headache.  You have blurred or double vision.  You have muscle twitching or spasms.  You have shortness of breath.  You have blue fingernails or lips.  You have blood in your urine. MAKE SURE YOU:  Understand these instructions.  Will watch your condition.  Will get help right away if you are not doing well or get worse.   This information is not intended to replace advice given to you by your health care provider. Make sure you discuss any questions you have with your health care  provider.   Document Released: 10/07/2010 Document Revised: 02/09/2014 Document Reviewed: 08/18/2012 Elsevier Interactive Patient Education 2016 [employer] Inc[employer]  Edema Edema is an abnormal buildup of fluids in your bodytissues. Edema is somewhatdependent on gravity to pull the fluid to the lowest place in your body. That makes the condition more common in the legs and thighs (lower extremities). Painless swelling of the feet and ankles is common and becomes more likely as you get older. It is also common in looser tissues, like around your eyes.  When the affected area is squeezed, the fluid may move out of that spot and leave a dent for a few moments. This dent is called pitting.  CAUSES  There are many possible causes of edema. Eating too much salt and being on your feet or sitting for a long time can cause edema in your legs and ankles. Hot weather may make edema worse. Common medical causes of edema include:  Heart failure.  Liver disease.  Kidney disease.  Weak blood vessels in your legs.  Cancer.  An injury.  Pregnancy.  Some medications.  Obesity. SYMPTOMS  Edema is usually painless.Your skin may look swollen or shiny.  DIAGNOSIS  Your health care provider may be able to diagnose edema by asking about your medical history and doing a physical exam. You may need to have tests such as X-rays, an electrocardiogram, or  blood tests to check for medical conditions that may cause edema.  TREATMENT  Edema treatment depends on the cause. If you have heart, liver, or kidney disease, you need the treatment appropriate for these conditions. General treatment may include:  Elevation of the affected body part above the level of your heart.  Compression of the affected body part. Pressure from elastic bandages or support stockings squeezes the tissues and forces fluid back into the blood vessels. This keeps fluid from entering the tissues.  Restriction of fluid and salt  intake.  Use of a water pill (diuretic). These medications are appropriate only for some types of edema. They pull fluid out of your body and make you urinate more often. This gets rid of fluid and reduces swelling, but diuretics can have side effects. Only use diuretics as directed by your health care provider. HOME CARE INSTRUCTIONS   Keep the affected body part above the level of your heart when you are lying down.   Do not sit still or stand for prolonged periods.   Do not put anything directly under your knees when lying down.  Do not wear constricting clothing or garters on your upper legs.   Exercise your legs to work the fluid back into your blood vessels. This may help the swelling go down.   Wear elastic bandages or support stockings to reduce ankle swelling as directed by your health care provider.   Eat a low-salt diet to reduce fluid if your health care provider recommends it.   Only take medicines as directed by your health care provider. SEEK MEDICAL CARE IF:   Your edema is not responding to treatment.  You have heart, liver, or kidney disease and notice symptoms of edema.  You have edema in your legs that does not improve after elevating them.   You have sudden and unexplained weight gain. SEEK IMMEDIATE MEDICAL CARE IF:   You develop shortness of breath or chest pain.   You cannot breathe when you lie down.  You develop pain, redness, or warmth in the swollen areas.   You have heart, liver, or kidney disease and suddenly get edema.  You have a fever and your symptoms suddenly get worse. MAKE SURE YOU:   Understand these instructions.  Will watch your condition.  Will get help right away if you are not doing well or get worse.   This information is not intended to replace advice given to you by your health care provider. Make sure you discuss any questions you have with your health care provider.   Document Released: 01/19/2005 Document  Revised: 02/09/2014 Document Reviewed: 11/11/2012 Elsevier Interactive Patient Education [employer]

## 2015-05-29 NOTE — MAU Note (Signed)
C/S on 4/19, legs & ankles are extremely swollen, having burning with urination for the past 3 days.  Was seen by home nurse today, BP was elevated.  Was SOB last night, not today.

## 2015-05-29 NOTE — MAU Provider Note (Signed)
Chief Complaint: Leg Swelling and Dysuria   None     SUBJECTIVE HPI: Eileen Holmes is a 35 y.o. [id number] on POD # 7 after RLTCS x5 who presents to maternity admissions reporting swollen legs with more swelling of right leg/foot starting 3-4 days ago and gradually worsening; burning with urination x 3-4 days that is unchanged since onset; abdominal pain not managed by ibuprofen; shortness of breath when supine x3-4 days that is unchanged; and elevated BP at home nurse visit today.  She reports hypertension treated with one previous pregnancy, but only occasional high pressures with this pregnancy.  She has not tried any treatments for her symptoms, other than ibuprofen for pain.  She reports her shortness of breath and leg swelling as the most concerning symptoms today.  She also mentions cracked nipples and wants to know how to treat this.  She is breastfeeding and has pain with nursing similar to pain with her previous babies.  She does not have a pump at home and does not feel like the baby is getting enough milk so she is supplementing with formula.  She reports scant lochia, denies vaginal itching/burning, h/a, dizziness, n/v, or fever/chills.     HPI  Past Medical History  Diagnosis Date  . No pertinent past medical history   . IBS (irritable bowel syndrome)    Past Surgical History  Procedure Laterality Date  . Cesarean section    . Multiple tooth extractions    . Cesarean section with bilateral tubal ligation Bilateral 05/22/2015    Procedure: CESAREAN SECTION WITH BILATERAL TUBAL LIGATION;  Surgeon: Adam PhenixJames G Arnold, MD;  Location: [facility];  Service: Obstetrics;  Laterality: Bilateral;   Social History   Social History  . Marital Status: Married    Spouse Name: N/A  . Number of Children: N/A  . Years of Education: N/A   Occupational History  . Not on file.   Social History Main Topics  . Smoking status: Never Smoker   . Smokeless tobacco: Not on file  . Alcohol Use: No  .  Drug Use: No  . Sexual Activity: Yes   Other Topics Concern  . Not on file   Social History Narrative   No current facility-administered medications on file prior to encounter.   Current Outpatient Prescriptions on File Prior to Encounter  Medication Sig Dispense Refill  . ibuprofen (ADVIL,MOTRIN) 600 MG tablet Take 1 tablet (600 mg total) by mouth every 6 (six) hours. (Patient taking differently: Take 600 mg by mouth every 6 (six) hours as needed for moderate pain. ) 30 tablet 0  . Prenatal Vit-Fe Fumarate-FA (PRENATAL MULTIVITAMIN) TABS tablet Take 1 tablet by mouth daily at 12 noon.     No Known Allergies  ROS:  Review of Systems  Constitutional: Negative for fever, chills and fatigue.  Eyes: Negative for visual disturbance.  Respiratory: Positive for shortness of breath.   Cardiovascular: Negative for chest pain.  Gastrointestinal: Negative for nausea, vomiting and abdominal pain.  Genitourinary: Positive for dysuria and vaginal bleeding. Negative for flank pain, vaginal discharge, difficulty urinating and pelvic pain.  Neurological: Negative for dizziness and headaches.  Psychiatric/Behavioral: Negative.      I have reviewed patient's Past Medical Hx, Surgical Hx, Family Hx, Social Hx, medications and allergies.   Physical Exam   Patient Vitals for the past 24 hrs:  BP Temp Temp src Pulse Resp SpO2  05/29/15 2001 149/78 mmHg 98.1 F (36.7 C) Oral (!) 42 18 -  05/29/15 1946  141/81 mmHg - - (!) 44 - -  05/29/15 1931 147/74 mmHg - - (!) 46 - -  05/29/15 1916 147/76 mmHg - - (!) 44 - -  05/29/15 1846 153/75 mmHg - - (!) 44 - -  05/29/15 1838 148/79 mmHg - - (!) 45 - -  05/29/15 1816 139/75 mmHg 98.2 F (36.8 C) Oral (!) 50 18 100 %   Constitutional: Well-developed, well-nourished female in no acute distress.  Cardiovascular: normal rate Respiratory: normal effort GI: Abd soft, non-tender. Pos BS x 4 MS: Extremities nontender, no edema, normal ROM Neurologic: Alert  and oriented x 4.  GU: Neg CVAT.  PELVIC EXAM: Deferred  LAB RESULTS Results for orders placed or performed during the hospital encounter of 05/29/15 (from the past 24 hour(s))  Urinalysis, Routine w reflex microscopic (not at [facility])     Status: Abnormal   Collection Time: 05/29/15  6:20 PM  Result Value Ref Range   Color, Urine YELLOW YELLOW   APPearance CLEAR CLEAR   Specific Gravity, Urine 1.015 1.005 - 1.030   pH 5.5 5.0 - 8.0   Glucose, UA NEGATIVE NEGATIVE mg/dL   Hgb urine dipstick NEGATIVE NEGATIVE   Bilirubin Urine NEGATIVE NEGATIVE   Ketones, ur NEGATIVE NEGATIVE mg/dL   Protein, ur NEGATIVE NEGATIVE mg/dL   Nitrite NEGATIVE NEGATIVE   Leukocytes, UA TRACE (A) NEGATIVE  Urine microscopic-add on     Status: Abnormal   Collection Time: 05/29/15  6:20 PM  Result Value Ref Range   Squamous Epithelial / LPF 0-5 (A) NONE SEEN   WBC, UA 0-5 0 - 5 WBC/hpf   RBC / HPF 0-5 0 - 5 RBC/hpf   Bacteria, UA FEW (A) NONE SEEN  Protein / creatinine ratio, urine     Status: None   Collection Time: 05/29/15  6:20 PM  Result Value Ref Range   Creatinine, Urine 55.00 mg/dL   Total Protein, Urine <6 mg/dL   Protein Creatinine Ratio        0.00 - 0.15 mg/mg[Cre]  CBC     Status: Abnormal   Collection Time: 05/29/15  6:58 PM  Result Value Ref Range   WBC 7.0 4.0 - 10.5 K/uL   RBC 3.52 (L) 3.87 - 5.11 MIL/uL   Hemoglobin 9.1 (L) 12.0 - 15.0 g/dL   HCT 16.1 (L) 09.6 - 04.5 %   MCV 81.0 78.0 - 100.0 fL   MCH 25.9 (L) 26.0 - 34.0 pg   MCHC 31.9 30.0 - 36.0 g/dL   RDW 40.9 (H) 81.1 - 91.4 %   Platelets 257 150 - 400 K/uL  Comprehensive metabolic panel     Status: Abnormal   Collection Time: 05/29/15  6:58 PM  Result Value Ref Range   Sodium 137 135 - 145 mmol/L   Potassium 3.7 3.5 - 5.1 mmol/L   Chloride 108 101 - 111 mmol/L   CO2 23 22 - 32 mmol/L   Glucose, Bld 114 (H) 65 - 99 mg/dL   BUN 15 6 - 20 mg/dL   Creatinine, Ser 7.82 0.44 - 1.00 mg/dL   Calcium 8.5 (L) 8.9 - 10.3 mg/dL    Total Protein 6.3 (L) 6.5 - 8.1 g/dL   Albumin 2.9 (L) 3.5 - 5.0 g/dL   AST 28 15 - 41 U/L   ALT 26 14 - 54 U/L   Alkaline Phosphatase 112 38 - 126 U/L   Total Bilirubin 0.1 (L) 0.3 - 1.2 mg/dL   GFR calc non Af Amer >  60 >60 mL/min   GFR calc Af Amer >60 >60 mL/min   Anion gap 6 5 - 15    --/--/AB POS, AB POS (04/18 1200)  IMAGING [place] [facility] Ob Follow Up  05/07/2015  OBSTETRICAL ULTRASOUND: This exam was performed within a [facility]. The OB US report was generated in the AS system, and faxed to the ordering physician.  This report is available in the [employer]. See the [facility] Obstetric US report via the Image Link.   MAU Management/MDM: Ordered labs and reviewed results.  No evidence of preeclampsia today with borderline BP and normal labs. No evidence of UTI with normal urine. Urine sent for culture.  Consult Dr Despina Hidden.   Will treat edema with Lasix 20 mg PO x 1 dose in MAU, then HCTZ 25 mg daily at home. F/U BP on Friday, either with home health visit or in [facility].  Percocet 5/325, take 1-2 tabs Q 4 hours PRN x 30 tabs, pt given 2 tabs in MAU.  Discussed treatments for cracked nipples, including hydrocortisone and polysporin cream and pt given printed materials from [email or web address] regarding evidence based breastfeeding information.  Handpump provided to aid breast emptying/increase milk supply.  Pt to f/u on Friday for BP check and then in 6 weeks for PP visit as scheduled.  Pt stable at time of discharge.  ASSESSMENT 1. Hypertension, postpartum condition or complication   2. Dysuria   3. Edema in pregnancy, postpartum condition   4. Cracked nipple, obstetric, postpartum condition     PLAN Discharge home   Medication List    STOP taking these medications        oxyCODONE 5 MG immediate release tablet  Commonly known as:  Oxy IR/ROXICODONE      TAKE these medications        hydrochlorothiazide 25 MG tablet  Commonly known as:  HYDRODIURIL  Take 1 tablet (25 mg  total) by mouth daily.     ibuprofen 600 MG tablet  Commonly known as:  ADVIL,MOTRIN  Take 1 tablet (600 mg total) by mouth every 6 (six) hours.     oxyCODONE-acetaminophen 5-325 MG tablet  Commonly known as:  PERCOCET/ROXICET  Take 1-2 tablets by mouth every 4 (four) hours as needed for severe pain.     prenatal multivitamin Tabs tablet  Take 1 tablet by mouth daily at 12 noon.       Follow-up Information    Follow up with [facility].   Specialty:  Obstetrics and Gynecology   Why:  The clinic will call you for a blood pressure check on Friday.     Contact information:   [address] [place] [address] [phone]      Please follow up.   Why:  Try hydrocortisone cream and polysporin (double antibiotic) cream for cracked nipples.       Sharen Counter Certified Nurse-Midwife 05/29/2015  8:57 PM

## 2015-05-30 ENCOUNTER — Telehealth: Payer: Self-pay

## 2015-05-30 NOTE — Telephone Encounter (Signed)
I have attempted to call this patient in regards to her elevated blood pressure. I left a message for her to return call to the clinic.  [id number] was seen in MAU 7 days after her C/S for elevated BP. She needs a BP check on Friday and is trying to set this up with home health. Can we call her to see if she has a home visit? If not, I want her to come to clinic Friday for nurse visit and BP check. Thank you.

## 2015-05-31 LAB — URINE CULTURE: Culture: 2000 — AB

## 2015-06-03 ENCOUNTER — Inpatient Hospital Stay (HOSPITAL_COMMUNITY)
Admission: AD | Admit: 2015-06-03 | Discharge: 2015-06-03 | Disposition: A | Discharge status: Home / Self Care | Payer: Medicaid Other | Source: Ambulatory Visit | Attending: Family Medicine | Admitting: Family Medicine

## 2015-06-03 ENCOUNTER — Encounter (HOSPITAL_COMMUNITY): Payer: Self-pay

## 2015-06-03 DIAGNOSIS — I10 Essential (primary) hypertension: Secondary | ICD-10-CM | POA: Diagnosis not present

## 2015-06-03 DIAGNOSIS — Z0000 Encounter for general adult medical examination without abnormal findings: Secondary | ICD-10-CM

## 2015-06-03 DIAGNOSIS — R030 Elevated blood-pressure reading, without diagnosis of hypertension: Secondary | ICD-10-CM | POA: Insufficient documentation

## 2015-06-03 LAB — URINALYSIS, ROUTINE W REFLEX MICROSCOPIC
Bilirubin Urine: NEGATIVE
GLUCOSE, UA: NEGATIVE mg/dL
Ketones, ur: NEGATIVE mg/dL
LEUKOCYTES UA: NEGATIVE
NITRITE: NEGATIVE
PROTEIN: NEGATIVE mg/dL
Specific Gravity, Urine: 1.02 (ref 1.005–1.030)
pH: 6 (ref 5.0–8.0)

## 2015-06-03 LAB — URINE MICROSCOPIC-ADD ON

## 2015-06-03 NOTE — Telephone Encounter (Signed)
Spoke with patient this am. Pt will come into the office today so that we can check her blood pressure.

## 2015-06-03 NOTE — Discharge Instructions (Signed)
Hypertension Hypertension, commonly called high blood pressure, is when the force of blood pumping through your arteries is too strong. Your arteries are the blood vessels that carry blood from your heart throughout your body. A blood pressure reading consists of a higher number over a lower number, such as 110/72. The higher number (systolic) is the pressure inside your arteries when your heart pumps. The lower number (diastolic) is the pressure inside your arteries when your heart relaxes. Ideally you want your blood pressure below 120/80. Hypertension forces your heart to work harder to pump blood. Your arteries may become narrow or stiff. Having untreated or uncontrolled hypertension can cause heart attack, stroke, kidney disease, and other problems. RISK FACTORS Some risk factors for high blood pressure are controllable. Others are not.  Risk factors you cannot control include:   Race. You may be at higher risk if you are African American.  Age. Risk increases with age.  Gender. Men are at higher risk than women before age 45 years. After age 65, women are at higher risk than men. Risk factors you can control include:  Not getting enough exercise or physical activity.  Being overweight.  Getting too much fat, sugar, calories, or salt in your diet.  Drinking too much alcohol. SIGNS AND SYMPTOMS Hypertension does not usually cause signs or symptoms. Extremely high blood pressure (hypertensive crisis) may cause headache, anxiety, shortness of breath, and nosebleed. DIAGNOSIS To check if you have hypertension, your health care provider will measure your blood pressure while you are seated, with your arm held at the level of your heart. It should be measured at least twice using the same arm. Certain conditions can cause a difference in blood pressure between your right and left arms. A blood pressure reading that is higher than normal on one occasion does not mean that you need treatment. If  it is not clear whether you have high blood pressure, you may be asked to return on a different day to have your blood pressure checked again. Or, you may be asked to monitor your blood pressure at home for 1 or more weeks. TREATMENT Treating high blood pressure includes making lifestyle changes and possibly taking medicine. Living a healthy lifestyle can help lower high blood pressure. You may need to change some of your habits. Lifestyle changes may include:  Following the DASH diet. This diet is high in fruits, vegetables, and whole grains. It is low in salt, red meat, and added sugars.  Keep your sodium intake below 2,300 mg per day.  Getting at least 30-45 minutes of aerobic exercise at least 4 times per week.  Losing weight if necessary.  Not smoking.  Limiting alcoholic beverages.  Learning ways to reduce stress. Your health care provider may prescribe medicine if lifestyle changes are not enough to get your blood pressure under control, and if one of the following is true:  You are 18-59 years of age and your systolic blood pressure is above 140.  You are 60 years of age or older, and your systolic blood pressure is above 150.  Your diastolic blood pressure is above 90.  You have diabetes, and your systolic blood pressure is over 140 or your diastolic blood pressure is over 90.  You have kidney disease and your blood pressure is above 140/90.  You have heart disease and your blood pressure is above 140/90. Your personal target blood pressure may vary depending on your medical conditions, your age, and other factors. HOME CARE INSTRUCTIONS    Have your blood pressure rechecked as directed by your health care provider.   Take medicines only as directed by your health care provider. Follow the directions carefully. Blood pressure medicines must be taken as prescribed. The medicine does not work as well when you skip doses. Skipping doses also puts you at risk for  problems.  Do not smoke.   Monitor your blood pressure at home as directed by your health care provider. SEEK MEDICAL CARE IF:   You think you are having a reaction to medicines taken.  You have recurrent headaches or feel dizzy.  You have swelling in your ankles.  You have trouble with your vision. SEEK IMMEDIATE MEDICAL CARE IF:  You develop a severe headache or confusion.  You have unusual weakness, numbness, or feel faint.  You have severe chest or abdominal pain.  You vomit repeatedly.  You have trouble breathing. MAKE SURE YOU:   Understand these instructions.  Will watch your condition.  Will get help right away if you are not doing well or get worse.   This information is not intended to replace advice given to you by your health care provider. Make sure you discuss any questions you have with your health care provider.   Document Released: 01/19/2005 Document Revised: 06/05/2014 Document Reviewed: 11/11/2012 Elsevier Interactive Patient Education 2016 Elsevier Inc.  

## 2015-06-03 NOTE — MAU Note (Signed)
BP 142/88 by home health nurse. States BP was elevated at D/c and has been to MAU once before today for same thing. Taking HCTZ 25mg  daily

## 2015-06-03 NOTE — MAU Provider Note (Signed)
History   [id number] sent in by home health nurse nurse for elevated BP of 130's/ 80's. Denies any other complaints.  CSN: [id number]  Arrival date & time 06/03/15  1423   None     Chief Complaint  Patient presents with  . Hypertension    HPI  Past Medical History  Diagnosis Date  . No pertinent past medical history   . IBS (irritable bowel syndrome)     Past Surgical History  Procedure Laterality Date  . Cesarean section    . Multiple tooth extractions    . Cesarean section with bilateral tubal ligation Bilateral 05/22/2015    Procedure: CESAREAN SECTION WITH BILATERAL TUBAL LIGATION;  Surgeon: Adam PhenixJames G Arnold, MD;  Location: [facility];  Service: Obstetrics;  Laterality: Bilateral;    No family history on file.  Social History  Substance Use Topics  . Smoking status: Never Smoker   . Smokeless tobacco: Not on file  . Alcohol Use: No    OB History    Gravida Para Term Preterm AB TAB SAB Ectopic Multiple Living   7 5 5  2  2   0 5      Review of Systems  Constitutional: Negative.   HENT: Negative.   Eyes: Negative.   Respiratory: Negative.   Cardiovascular: Negative.   Gastrointestinal: Negative.   Endocrine: Negative.   Genitourinary: Negative.   Musculoskeletal: Negative.   Skin: Negative.   Allergic/Immunologic: Negative.   Neurological: Negative.   Hematological: Negative.   Psychiatric/Behavioral: Negative.     Allergies  Review of patient's allergies indicates no known allergies.  Home Medications  No current outpatient prescriptions on file.  BP 119/73 mmHg  Pulse 65  Temp(Src) 98.2 F (36.8 C)  Resp 18  Ht 5\' 5"  (1.651 m)  Wt 197 lb (89.359 kg)  BMI 32.78 kg/m2  Physical Exam  Constitutional: She is oriented to person, place, and time. She appears well-developed and well-nourished.  HENT:  Head: Normocephalic.  Eyes: Pupils are equal, round, and reactive to light.  Neck: Normal range of motion.  Cardiovascular: Normal rate, regular  rhythm, normal heart sounds and intact distal pulses.   Pulmonary/Chest: Effort normal and breath sounds normal.  Abdominal: Soft. Bowel sounds are normal.  Musculoskeletal: Normal range of motion.  Neurological: She is alert and oriented to person, place, and time. She has normal reflexes.  Skin: Skin is warm and dry.  Psychiatric: She has a normal mood and affect. Her behavior is normal. Judgment and thought content normal.    MAU Course  Procedures (including critical care time)  Labs Reviewed  URINALYSIS, ROUTINE W REFLEX MICROSCOPIC (NOT AT [facility]) - Abnormal; Notable for the following:    Hgb urine dipstick SMALL (*)    All other components within normal limits  URINE MICROSCOPIC-ADD ON - Abnormal; Notable for the following:    Squamous Epithelial / LPF 0-5 (*)    Bacteria, UA FEW (*)    All other components within normal limits   No results found.   No diagnosis found.    MDM  VSS, DTR's tr, no clonus. Will d/c home.

## 2015-06-09 ENCOUNTER — Encounter (HOSPITAL_COMMUNITY): Payer: Self-pay | Admitting: *Deleted

## 2015-06-09 ENCOUNTER — Inpatient Hospital Stay (HOSPITAL_COMMUNITY)
Admission: AD | Admit: 2015-06-09 | Discharge: 2015-06-09 | Disposition: A | Discharge status: Home / Self Care | Payer: Medicaid Other | Source: Ambulatory Visit | Attending: Obstetrics & Gynecology | Admitting: Obstetrics & Gynecology

## 2015-06-09 ENCOUNTER — Inpatient Hospital Stay (HOSPITAL_COMMUNITY): Payer: Medicaid Other

## 2015-06-09 DIAGNOSIS — K589 Irritable bowel syndrome without diarrhea: Secondary | ICD-10-CM | POA: Insufficient documentation

## 2015-06-09 DIAGNOSIS — N939 Abnormal uterine and vaginal bleeding, unspecified: Secondary | ICD-10-CM | POA: Diagnosis present

## 2015-06-09 LAB — CBC
HCT: 36.9 % (ref 36.0–46.0)
Hemoglobin: 11.6 g/dL — ABNORMAL LOW (ref 12.0–15.0)
MCH: 25.3 pg — ABNORMAL LOW (ref 26.0–34.0)
MCHC: 31.4 g/dL (ref 30.0–36.0)
MCV: 80.4 fL (ref 78.0–100.0)
Platelets: 377 10*3/uL (ref 150–400)
RBC: 4.59 MIL/uL (ref 3.87–5.11)
RDW: 16.3 % — ABNORMAL HIGH (ref 11.5–15.5)
WBC: 7.4 10*3/uL (ref 4.0–10.5)

## 2015-06-09 IMAGING — US US TRANSVAGINAL NON-OB
1 series · 15 of 25 positions shown · non-contrast
Comparison: None

CLINICAL DATA: Vaginal bleeding for 2 weeks following a C-section.
Evaluate for retained products of conception.

EXAM:
TRANSABDOMINAL AND TRANSVAGINAL ULTRASOUND OF PELVIS
TECHNIQUE: Both transabdominal and transvaginal ultrasound examinations of the
pelvis were performed. Transabdominal technique was performed for
global imaging of the pelvis including uterus, ovaries, adnexal
regions, and pelvic cul-de-sac. It was necessary to proceed with
endovaginal exam following the transabdominal exam to visualize the
ovaries and endometrium..

[Series 1: us transvaginal non-ob · 15 of 40 slices shown]
[im 1/40]
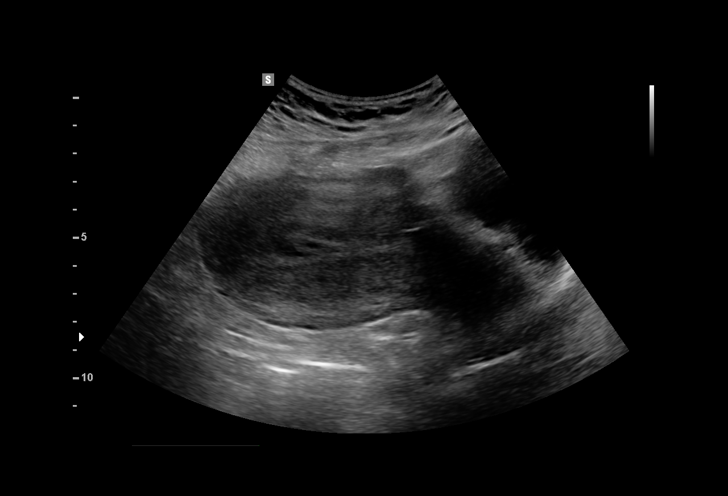
[im 4/40]
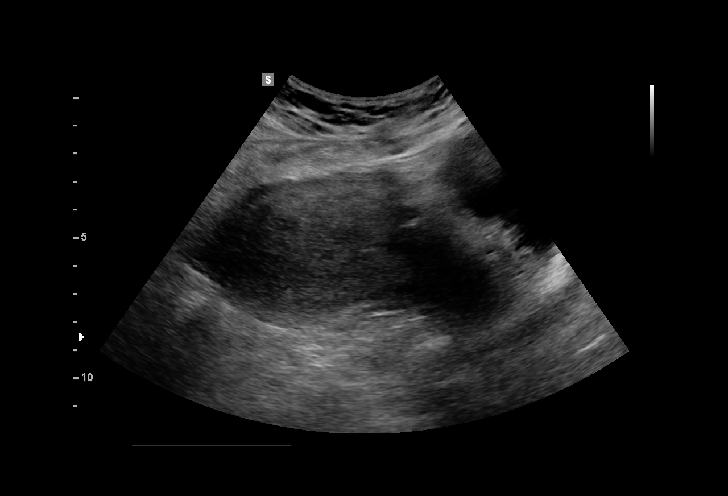
[im 7/40]
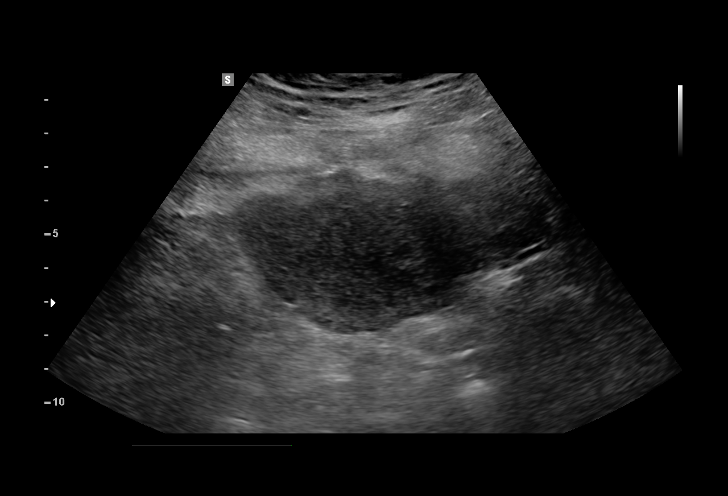
[im 9/40]
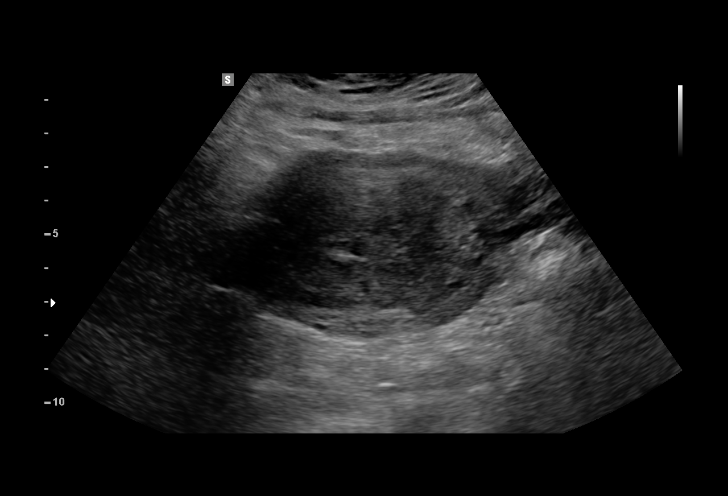
[im 12/40]
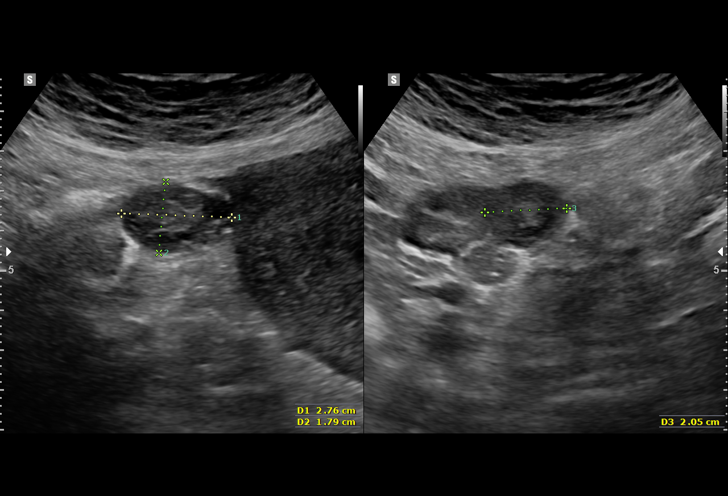
[im 15/40]
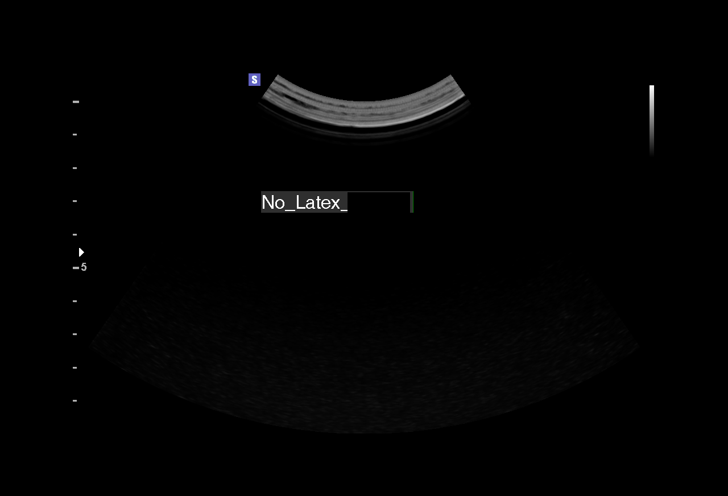
[im 17/40]
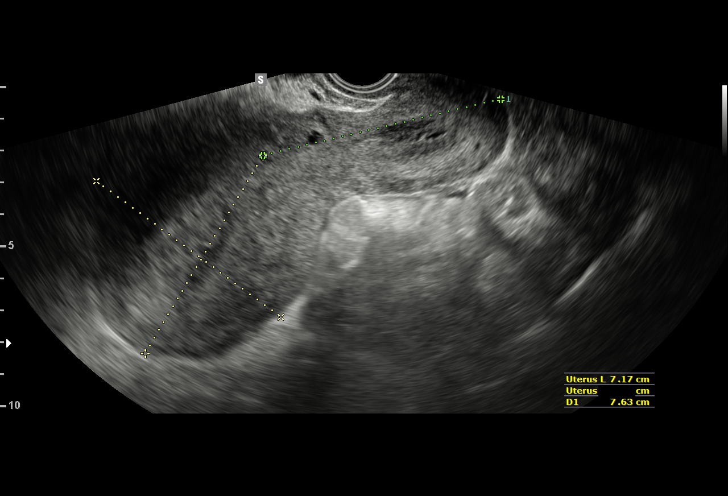
[im 20/40]
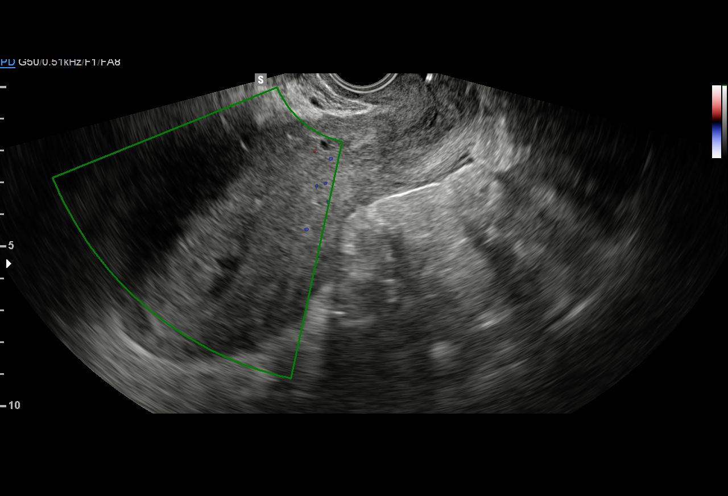
[im 23/40]
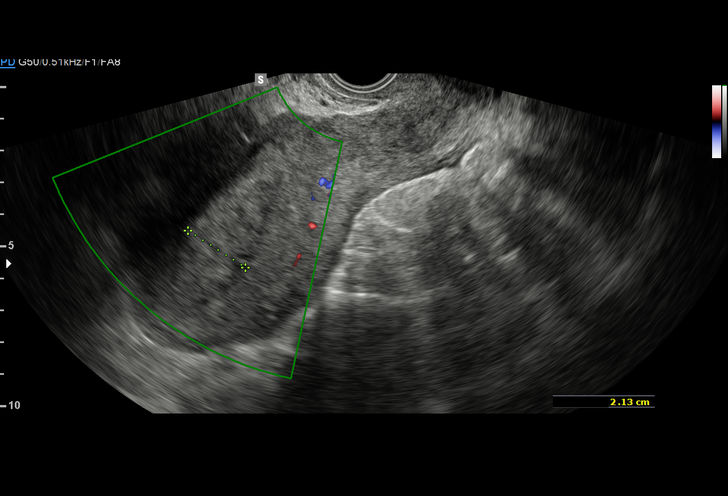
[im 25/40]
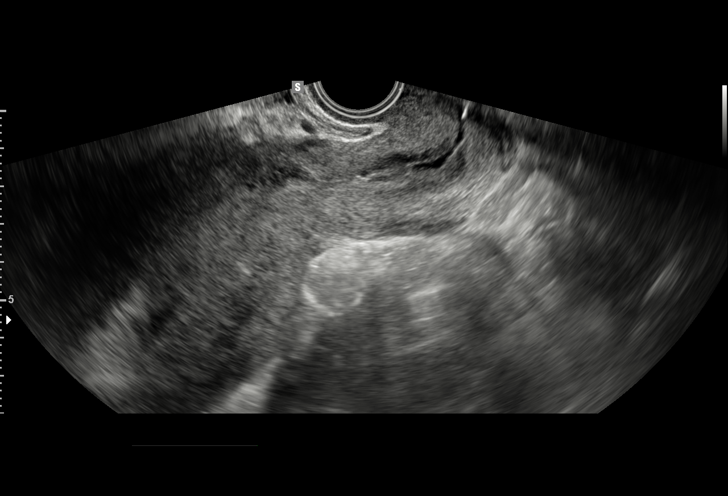
[im 28/40]
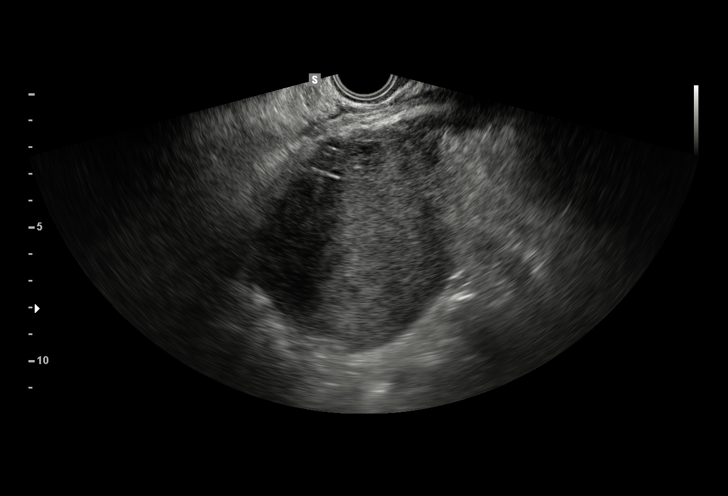
[im 31/40]
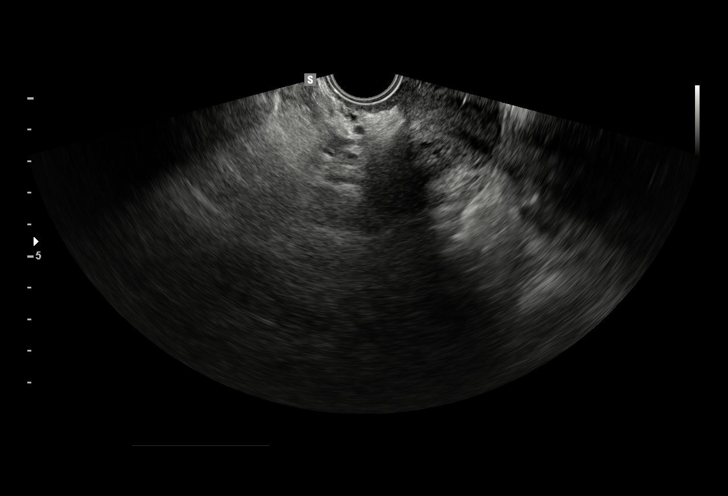
[im 33/40]
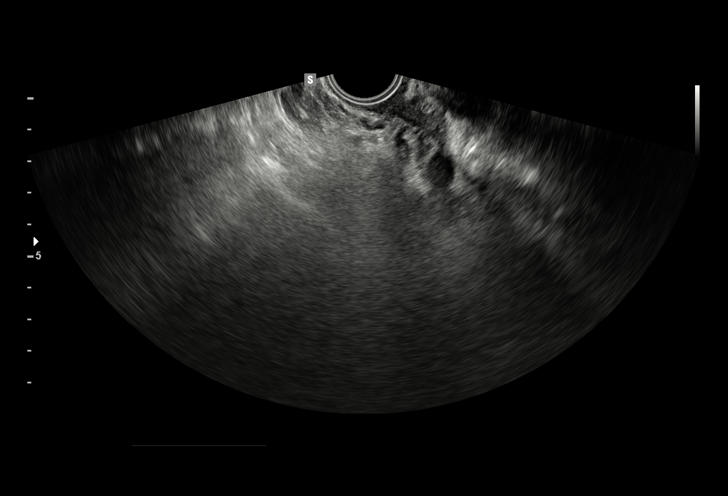
[im 36/40]
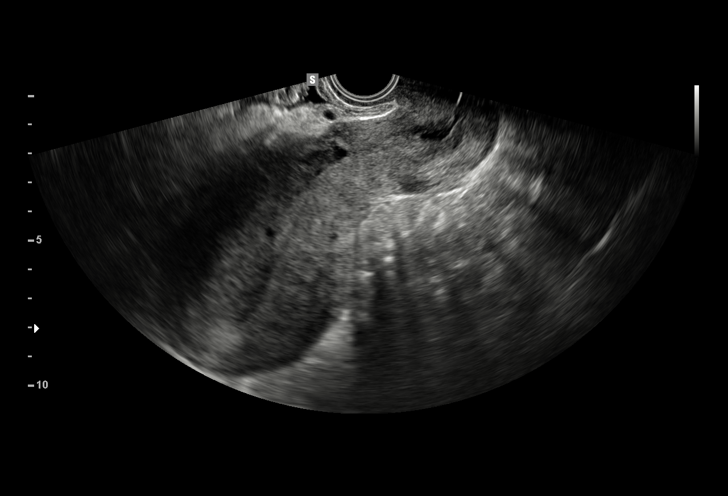
[im 40/40]
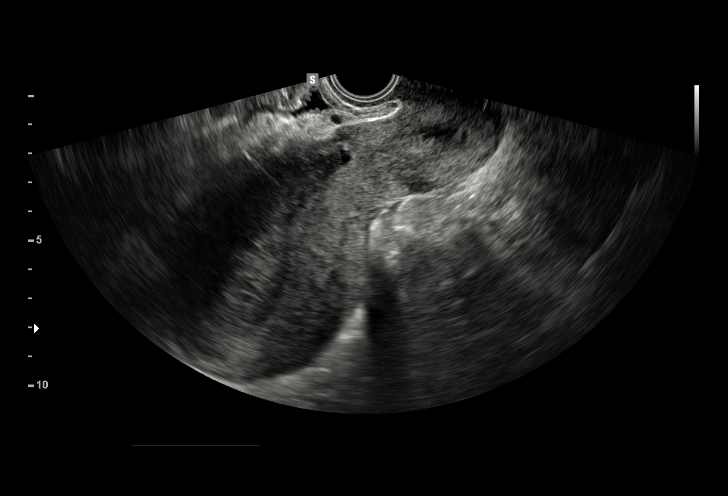

[15 of 25 positions shown; findings below may reference images not displayed]

FINDINGS: Uterus

Measurements: 14.8 x 7.2 x 7.8 cm. Expected postpartum enlargement..
C-section scars are noted. No fibroids.

Endometrium

Thickness: 21.3 mm. No areas of vascularity to suggest retained
products. No endometrial fluid collections.

Right ovary

Measurements: 2.7 x 1.8 x 2.0 cm. Normal appearance/no adnexal mass.

Left ovary

Measurements: 3.5 x 1.8 x 2.7 cm. Normal appearance/no adnexal mass.

Other findings

No abnormal free fluid.
IMPRESSION: 1. Uniform, slightly thickened endometrium but no masses or
vascularity to suggest retained products of conception.
2. Normal ovaries.
3. No free fluid.

## 2015-06-09 MED ORDER — MISOPROSTOL 200 MCG PO TABS
400.0000 ug | ORAL_TABLET | Freq: Once | ORAL | Status: AC
  Administered 2015-06-09: 400 ug via BUCCAL
  Filled 2015-06-09: qty 2

## 2015-06-09 NOTE — Discharge Instructions (Signed)
Postpartum Hemorrhage °Postpartum hemorrhage is excessive blood loss after childbirth. Some blood loss is normal after delivering a baby. However, postpartum hemorrhage is a potentially serious condition.  °CAUSES  °· A loss of muscle tone in the uterus after childbirth. °· Failure to deliver all of the placenta. °· Wounds in the birth canal caused by delivery of the fetus. °· A maternal bleeding disorder that prevents blood clotting (rare). °RISK FACTORS °You are at greater risk for postpartum hemorrhage if you: °· Have a history of postpartum hemorrhage. °· Have delivered more than one baby. °· Had preeclampsia or eclampsia. °· Had problems with the placenta. °· Had complications during your labor or delivery. °· Are obese. °· Are Asian or Hispanic. °SIGNS AND SYMPTOMS  °Vaginal bleeding after delivery is normal and should be expected. Bleeding (lochia) will occur for several days after childbirth. This can be expected with normal vaginal deliveries and cesarean deliveries.  °You are bleeding too much after your delivery if you are: °· Passing large clots or pieces of tissue. This may be small pieces of placenta left after delivery.   °· Soaking more than one sanitary pad per hour for several hours.   °· Having heavy, bright-red bleeding that occurs 4 days or more after delivery.   °· Having a discharge that has a bad smell or if you begin to run an unexplained fever.   °· Having times of lightheadedness or fainting, feeling short of breath, or having your heart beat fast with very little activity.   °DIAGNOSIS  °A diagnosis is based on your symptoms and a physical exam of your perineum, vagina, cervix, and uterus. Diagnostic tests may include: °· Blood pressure and pulse. °· Blood tests. °· Blood clotting tests. °· Ultrasonography. °TREATMENT °· Treatment is based on the severity of bleeding and may include: °¨ Uterine massage. °¨ Medicines. °¨ Blood transfusions. °· Sometimes bleeding occurs if portions of the  placenta are left behind in the uterus after delivery. If this happens, often a curettage or scraping of the inside of the uterus must be done. This usually stops the bleeding. If this treatment does not stop the bleeding, surgery (hysterectomy) may have to be performed to remove the uterus. °· If bleeding is due to clotting or bleeding problems that are not related to the pregnancy, other treatments may be needed.   °HOME CARE INSTRUCTIONS  °· Limit your activity as directed by your health care provider. Your health care provider may order bed rest (getting up to the bathroom only) or may allow you to continue light activity.   °· Keep track of the number of pads you use each day and how soaked (saturated) they are. Write this number down.   °· Do not use tampons. Do not douche or have sexual intercourse until approved by your health care provider.   °· Drink enough fluids to keep your urine clear or pale yellow.   °· Get proper amounts of rest.   °· Eat foods that are rich in iron, such as spinach, red meat, and legumes.   °SEEK IMMEDIATE MEDICAL CARE IF: °· You experience severe cramps in your stomach, back, or belly (abdomen).   °· You have a fever.   °· You pass large clots or tissue. Save any tissue for your health care provider to look at.   °· Your bleeding increases. °· You become weak or lightheaded, or you pass out.   °· Your sanitary pad count per hour is increasing. °MAKE SURE YOU: °· Understand these instructions. °· Will watch your condition. °· Will get help right away if   you are not doing well or get worse. °  °This information is not intended to replace advice given to you by your health care provider. Make sure you discuss any questions you have with your health care provider. °  °Document Released: 04/11/2003 Document Revised: 01/24/2013 Document Reviewed: 07/07/2012 °Elsevier Interactive Patient Education ©2016 Elsevier Inc. ° °

## 2015-06-09 NOTE — MAU Note (Signed)
C/S April 19th. States bleeding was minimal post op and became even less with some brown spots within days. Started bleeding heavy today and passed some clots.

## 2015-06-09 NOTE — MAU Provider Note (Signed)
History     CSN: [id number]  Arrival date and time: 06/09/15 1755   First Provider Initiated Contact with Patient 06/09/15 1908      Chief Complaint  Patient presents with  . Vaginal Bleeding   HPI Comments: Eileen Holmes is a 35 y.o. [id number] POD #17 from fifth LTCS and BTL presenting with heavy vaginal bleeding for 1 day. She reports bright red bleeding with flow heavier than a period. She saturated about 4 pads today. She also feels weak and dizzy. Following the C-section she had minimal vaginal bleeding that tapered to brown spotting and stopped completely for about 2 days. Denies any change in activity or heavy lifting. Denies malodorous lochia or vaginal irritation. She is otherwise doing well postpartum and is breast-feeding without difficulty. She continues to be on hydrochlorothiazide for her gestational hypertension. Op note reviewed. Surgery complicated by lateral adhesions. On 05/29/15 hemoglobin was 9.1.    Vaginal Bleeding The patient's primary symptoms include vaginal bleeding. This is a new problem. The current episode started today. The problem occurs constantly. The problem has been unchanged. The pain is mild. The problem affects both sides. Associated symptoms include abdominal pain. Pertinent negatives include no back pain, constipation, diarrhea, dysuria, fever, flank pain or vomiting. The vaginal bleeding is heavier than menses. She has been passing clots. She has not been passing tissue. Nothing aggravates the symptoms. She has tried nothing for the symptoms. She is not sexually active. She uses tubal ligation for contraception. Her past medical history is significant for an abdominal surgery.   OB History  Gravida Para Term Preterm AB SAB TAB Ectopic Multiple Living  0 5    # Outcome Date GA Lbr Len/2nd Weight Sex Delivery Anes PTL Lv  7 Term 05/22/15 [redacted]w[redacted]d  3.63 kg (8 lb) F CS-Vac Spinal  Y  6 Term 2013 [redacted]w[redacted]d    CS-LTranv     5 SAB 2010 [redacted]w[redacted]d          4 Term 2009 [redacted]w[redacted]d  3.629 kg (8 lb)  CS-LTranv     3 SAB 2008 [redacted]w[redacted]d         2 Term 2007 [redacted]w[redacted]d  3.175 kg (7 lb)  CS-LTranv     1 Term 2004 [redacted]w[redacted]d  4.536 kg (10 lb)  CS-LTranv          Past Medical History  Diagnosis Date  . No pertinent past medical history   . IBS (irritable bowel syndrome)     Past Surgical History  Procedure Laterality Date  . Cesarean section    . Multiple tooth extractions    . Cesarean section with bilateral tubal ligation Bilateral 05/22/2015    Procedure: CESAREAN SECTION WITH BILATERAL TUBAL LIGATION;  Surgeon: Eileen Phenix, MD;  Location: [facility];  Service: Obstetrics;  Laterality: Bilateral;    History reviewed. No pertinent family history.  Social History  Substance Use Topics  . Smoking status: Never Smoker   . Smokeless tobacco: None  . Alcohol Use: No    Allergies: No Known Allergies  Prescriptions prior to admission  Medication Sig Dispense Refill Last Dose  . hydrochlorothiazide (HYDRODIURIL) 25 MG tablet Take 1 tablet (25 mg total) by mouth daily. 30 tablet 0 06/02/2015 at Unknown time  . ibuprofen (ADVIL,MOTRIN) 600 MG tablet Take 1 tablet (600 mg total) by mouth every 6 (six) hours. (Patient taking differently: Take 600 mg by mouth every 6 (six) hours as needed for moderate pain. )  30 tablet 0 Unknown at Unknown time  . oxyCODONE-acetaminophen (PERCOCET/ROXICET) 5-325 MG tablet Take 1-2 tablets by mouth every 4 (four) hours as needed for severe pain. 30 tablet 0 06/02/2015 at Unknown time  . Prenatal Vit-Fe Fumarate-FA (PRENATAL MULTIVITAMIN) TABS tablet Take 1 tablet by mouth daily at 12 noon.   Unknown at Unknown time    Review of Systems  Constitutional: Negative for fever.  Gastrointestinal: Positive for abdominal pain. Negative for vomiting, diarrhea and constipation.  Genitourinary: Positive for vaginal bleeding. Negative for dysuria and flank pain.  Musculoskeletal: Negative for back pain.   Physical Exam   Blood pressure  107/76, pulse 91, temperature 98.4 F (36.9 C), temperature source Oral, resp. rate 16, unknown if currently breastfeeding. Filed Vitals:   06/09/15 1829 06/09/15 1832 06/09/15 1833 06/09/15 1835  BP: 115/69 114/70 108/72 107/76  Pulse: 61 65 89 91  Temp:      TempSrc:      Resp:       Physical Exam  Nursing note and vitals reviewed. Constitutional: She is oriented to person, place, and time. She appears well-developed and well-nourished.  Eyes: Pupils are equal, round, and reactive to light. No scleral icterus.  Cardiovascular: Normal rate.   Respiratory: Effort normal.  GI: Soft. There is tenderness. There is no rebound and no guarding.  Fundus firm about 3 FB below umbilicus Pfannenstiel incision healing well without inflammation  Genitourinary:  SSE: moderate dark blood. Small clot removed via sponge stick, followed by trickle from os.  Digital exam: cx closed.    Musculoskeletal: Normal range of motion.  Neurological: She is alert and oriented to person, place, and time.  Skin: Skin is warm and dry.  Psychiatric: She has a normal mood and affect. Her behavior is normal.    MAU Course  Procedures Results for orders placed or performed during the hospital encounter of 06/09/15 (from the past 24 hour(s))  CBC     Status: Abnormal   Collection Time: 06/09/15  6:47 PM  Result Value Ref Range   WBC 7.4 4.0 - 10.5 K/uL   RBC 4.59 3.87 - 5.11 MIL/uL   Hemoglobin 11.6 (L) 12.0 - 15.0 g/dL   HCT 16.136.9 09.636.0 - 04.546.0 %   MCV 80.4 78.0 - 100.0 fL   MCH 25.3 (L) 26.0 - 34.0 pg   MCHC 31.4 30.0 - 36.0 g/dL   RDW 40.916.3 (H) 81.111.5 - 91.415.5 %   Platelets 377 150 - 400 K/uL   MDM Consulted Dr. Penne LashLeggett: cytotec 400mcg buccal US top [facility] Care assumed by Ivonne AndrewV. Smith CNM at 2000  Assessment and Plan    [name],[name] 06/09/2015, 7:09 PM

## 2015-06-09 NOTE — MAU Provider Note (Signed)
Assumed care of Eileen Holmes at 2000. Scant blood on pad. Eileen Holmes reports scant bleeding since pelvic exam  Discussed Hx, exam, labs, US w/ Dr. Penne LashLeggett. Stable for DC.  [place] Transvaginal Non-ob  06/09/2015  CLINICAL DATA:  Vaginal bleeding for 2 weeks following a C-section. Evaluate for retained products of conception. EXAM: TRANSABDOMINAL AND TRANSVAGINAL ULTRASOUND OF PELVIS TECHNIQUE: Both transabdominal and transvaginal ultrasound examinations of the pelvis were performed. Transabdominal technique was performed for global imaging of the pelvis including uterus, ovaries, adnexal regions, and pelvic cul-de-sac. It was necessary to proceed with endovaginal exam following the transabdominal exam to visualize the ovaries and endometrium. COMPARISON:  None FINDINGS: Uterus Measurements: 14.8 x 7.2 x 7.8 cm. Expected postpartum enlargement. C-section scars are noted. No fibroids. Endometrium Thickness: 21.3 mm. No areas of vascularity to suggest retained products. No endometrial fluid collections. Right ovary Measurements: 2.7 x 1.8 x 2.0 cm. Normal appearance/no adnexal mass. Left ovary Measurements: 3.5 x 1.8 x 2.7 cm. Normal appearance/no adnexal mass. Other findings No abnormal free fluid. IMPRESSION: 1. Uniform, slightly thickened endometrium but no masses or vascularity to suggest retained products of conception. 2. Normal ovaries. 3. No free fluid. Electronically Signed   By: Rudie MeyerP.  Gallerani M.D.   On: 06/09/2015 20:38   [place] Pelvis Complete  06/09/2015  CLINICAL DATA:  Vaginal bleeding for 2 weeks following a C-section. Evaluate for retained products of conception. EXAM: TRANSABDOMINAL AND TRANSVAGINAL ULTRASOUND OF PELVIS TECHNIQUE: Both transabdominal and transvaginal ultrasound examinations of the pelvis were performed. Transabdominal technique was performed for global imaging of the pelvis including uterus, ovaries, adnexal regions, and pelvic cul-de-sac. It was necessary to proceed with endovaginal exam following the  transabdominal exam to visualize the ovaries and endometrium. COMPARISON:  None FINDINGS: Uterus Measurements: 14.8 x 7.2 x 7.8 cm. Expected postpartum enlargement. C-section scars are noted. No fibroids. Endometrium Thickness: 21.3 mm. No areas of vascularity to suggest retained products. No endometrial fluid collections. Right ovary Measurements: 2.7 x 1.8 x 2.0 cm. Normal appearance/no adnexal mass. Left ovary Measurements: 3.5 x 1.8 x 2.7 cm. Normal appearance/no adnexal mass. Other findings No abnormal free fluid. IMPRESSION: 1. Uniform, slightly thickened endometrium but no masses or vascularity to suggest retained products of conception. 2. Normal ovaries. 3. No free fluid. Electronically Signed   By: Rudie MeyerP.  Gallerani M.D.   On: 06/09/2015 20:38   ASSESSMENT: 1. Postpartum hemorrhage, delayed (> 24 hrs), postpartum condition   2. Vaginal bleeding    PLAN: D/C home per consult w/ Dr. Penne LashLeggett. D/C HCTZ. Increase fluids Follow-up Information    [facility]   Specialty:  Obstetrics and Gynecology   Why:  As needed if symptoms worsen   Contact information:   [address] [place] [id number] [phone]      Follow [facility] with [facility].   Why:  As needed in emergencies   Contact information:   [address] [id number] mc [place] [id number] [phone]        Medication List    STOP taking these medications        hydrochlorothiazide 25 MG tablet  Commonly known as:  HYDRODIURIL      TAKE these medications        ibuprofen 600 MG tablet  Commonly known as:  ADVIL,MOTRIN  Take 600 mg by mouth every 6 (six) hours as needed for mild pain.     oxyCODONE-acetaminophen 5-325 MG tablet  Commonly known  as:  PERCOCET/ROXICET  Take 1-2 tablets by mouth every 4 (four) hours as needed for severe pain.     PNV PRENATAL PLUS MULTIVITAMIN 27-1 MG Tabs  Take 1 tablet by  mouth daily.       [place], CNM 06/09/2015 9:20 PM

## 2015-12-18 ENCOUNTER — Encounter (HOSPITAL_COMMUNITY): Payer: Self-pay

## 2016-06-30 ENCOUNTER — Encounter (HOSPITAL_COMMUNITY): Payer: Self-pay | Admitting: *Deleted

## 2016-06-30 ENCOUNTER — Inpatient Hospital Stay (HOSPITAL_COMMUNITY)
Admission: AD | Admit: 2016-06-30 | Discharge: 2016-06-30 | Disposition: A | Discharge status: Home / Self Care | Payer: Medicaid Other | Source: Ambulatory Visit | Attending: Family Medicine | Admitting: Family Medicine

## 2016-06-30 DIAGNOSIS — K589 Irritable bowel syndrome without diarrhea: Secondary | ICD-10-CM | POA: Diagnosis not present

## 2016-06-30 DIAGNOSIS — N898 Other specified noninflammatory disorders of vagina: Secondary | ICD-10-CM | POA: Diagnosis not present

## 2016-06-30 DIAGNOSIS — R1030 Lower abdominal pain, unspecified: Secondary | ICD-10-CM | POA: Diagnosis not present

## 2016-06-30 DIAGNOSIS — Z79899 Other long term (current) drug therapy: Secondary | ICD-10-CM | POA: Diagnosis not present

## 2016-06-30 DIAGNOSIS — Z9889 Other specified postprocedural states: Secondary | ICD-10-CM | POA: Insufficient documentation

## 2016-06-30 DIAGNOSIS — Z3202 Encounter for pregnancy test, result negative: Secondary | ICD-10-CM | POA: Diagnosis not present

## 2016-06-30 DIAGNOSIS — N912 Amenorrhea, unspecified: Secondary | ICD-10-CM

## 2016-06-30 LAB — URINALYSIS, ROUTINE W REFLEX MICROSCOPIC
Bilirubin Urine: NEGATIVE
Glucose, UA: NEGATIVE mg/dL
HGB URINE DIPSTICK: NEGATIVE
Ketones, ur: NEGATIVE mg/dL
LEUKOCYTES UA: NEGATIVE
Nitrite: NEGATIVE
Protein, ur: NEGATIVE mg/dL
SPECIFIC GRAVITY, URINE: 1.02 (ref 1.005–1.030)
pH: 6 (ref 5.0–8.0)

## 2016-06-30 LAB — HCG, QUANTITATIVE, PREGNANCY

## 2016-06-30 LAB — CBC
HEMATOCRIT: 36.4 % (ref 36.0–46.0)
Hemoglobin: 12.1 g/dL (ref 12.0–15.0)
MCH: 29.1 pg (ref 26.0–34.0)
MCHC: 33.2 g/dL (ref 30.0–36.0)
MCV: 87.5 fL (ref 78.0–100.0)
PLATELETS: 261 10*3/uL (ref 150–400)
RBC: 4.16 MIL/uL (ref 3.87–5.11)
RDW: 13.1 % (ref 11.5–15.5)
WBC: 7.1 10*3/uL (ref 4.0–10.5)

## 2016-06-30 LAB — WET PREP, GENITAL
CLUE CELLS WET PREP: NONE SEEN
Sperm: NONE SEEN
TRICH WET PREP: NONE SEEN
Yeast Wet Prep HPF POC: NONE SEEN

## 2016-06-30 LAB — POCT PREGNANCY, URINE: Preg Test, Ur: NEGATIVE

## 2016-06-30 NOTE — Discharge Instructions (Signed)
Secondary Amenorrhea Secondary amenorrhea is the stopping of menstrual flow for 3-6 months in a female who has previously had periods. There are many possible causes. Most of these causes are not serious. Usually, treating the underlying problem causing the loss of menses will return your periods to normal. What are the causes? Some common and uncommon causes of not menstruating include:  Malnutrition.  Low blood sugar (hypoglycemia).  Polycystic ovary disease.  Stress or fear.  Breastfeeding.  Hormone imbalance.  Ovarian failure.  Medicines.  Extreme obesity.  Cystic fibrosis.  Low body weight or drastic weight reduction from any cause.  Early menopause.  Removal of ovaries or uterus.  Contraceptives.  Illness.  Long-term (chronic) illnesses.  Cushing syndrome.  Thyroid problems.  Birth control pills, patches, or vaginal rings for birth control. What increases the risk? You may be at greater risk of secondary amenorrhea if:  You have a family history of this condition.  You have an eating disorder.  You do athletic training. How is this diagnosed? A diagnosis is made by your health care provider taking a medical history and doing a physical exam. This will include a pelvic exam to check for problems with your reproductive organs. Pregnancy must be ruled out. Often, numerous blood tests are done to measure different hormones in the body. Urine testing may be done. Specialized exams (ultrasound, CT scan, MRI, or hysteroscopy) may have to be done as well as measuring the body mass index (BMI). How is this treated? Treatment depends on the cause of the amenorrhea. If an eating disorder is present, this can be treated with an adequate diet and therapy. Chronic illnesses may improve with treatment of the illness. Amenorrhea may be corrected with medicines, lifestyle changes, or surgery. If the amenorrhea cannot be corrected, it is sometimes possible to create a false  menstruation with medicines. Follow these instructions at home:  Maintain a healthy diet.  Manage weight problems.  Exercise regularly but not excessively.  Get adequate sleep.  Manage stress.  Be aware of changes in your menstrual cycle. Keep a record of when your periods occur. Note the date your period starts, how long it lasts, and any problems. Contact a health care provider if: Your symptoms do not get better with treatment. This information is not intended to replace advice given to you by your health care provider. Make sure you discuss any questions you have with your health care provider. Document Released: 03/02/2006 Document Revised: 06/27/2015 Document Reviewed: 07/07/2012 Elsevier Interactive Patient Education  2017 [employer] Inc[employer]  

## 2016-06-30 NOTE — MAU Provider Note (Signed)
History     CSN: [id number]  Arrival date and time: 06/30/16 1736   First Provider Initiated Contact with Patient 06/30/16 1815      Chief Complaint  Patient presents with  . Possible Pregnancy  . Vaginal Discharge  . Abdominal Pain   36 y.o. female here with amenorrhea and LAP. Pain started 3 days ago and is bilateral. She cannot describe it but rates 10/10. Has not taken anything for it. She reports similar pain prior to menses last month. She's been fasting for 14 days and only eating after 8pm when the pain is the worst. She also had +HPT yesterday. She reports malodorous thin white vaginal discharge. No fevers. No urinary sx. Had BTL last year. LMP 05/23/16.    OB History    Gravida Para Term Preterm AB Living   7 5 5   2 5    SAB TAB Ectopic Multiple Live Births   2     0 1      Past Medical History:  Diagnosis Date  . IBS (irritable bowel syndrome)     Past Surgical History:  Procedure Laterality Date  . CESAREAN SECTION    . CESAREAN SECTION WITH BILATERAL TUBAL LIGATION Bilateral 05/22/2015   Procedure: CESAREAN SECTION WITH BILATERAL TUBAL LIGATION;  Surgeon: Adam PhenixJames G Arnold, MD;  Location: [facility];  Service: Obstetrics;  Laterality: Bilateral;  . MULTIPLE TOOTH EXTRACTIONS      History reviewed. No pertinent family history.  Social History  Substance Use Topics  . Smoking status: Never Smoker  . Smokeless tobacco: Never Used  . Alcohol use No    Allergies: No Known Allergies  Prescriptions Prior to Admission  Medication Sig Dispense Refill Last Dose  . Prenatal Vit-Fe Fumarate-FA (PNV PRENATAL PLUS MULTIVITAMIN) 27-1 MG TABS Take 1 tablet by mouth daily.  4 Past Week at Unknown time    Review of Systems  Constitutional: Negative for fever.  Gastrointestinal: Positive for abdominal pain. Negative for constipation, diarrhea, nausea and vomiting.  Genitourinary: Positive for vaginal discharge. Negative for dysuria, frequency and urgency.   Physical Exam    Blood pressure 127/71, pulse 60, temperature 98 F (36.7 C), temperature source Oral, resp. rate 16, weight 86.2 kg (190 lb 0.6 oz), last menstrual period 05/23/2016, SpO2 100 %, unknown if currently breastfeeding.  Physical Exam  Nursing note and vitals reviewed. Constitutional: She is oriented to person, place, and time. She appears well-developed and well-nourished. No distress.  HENT:  Head: Normocephalic and atraumatic.  Neck: Normal range of motion.  Cardiovascular: Normal rate.   Respiratory: Effort normal.  GI: Soft. She exhibits no distension. There is tenderness in the right lower quadrant, suprapubic area and left lower quadrant.  Genitourinary:  Genitourinary Comments: External: no lesions or erythema Vagina: rugated, parous, scant thin white discharge Uterus: non enlarged, anteverted, + tender, no CMT Adnexae: no masses, + tenderness left, no tenderness right   Musculoskeletal: Normal range of motion.  Neurological: She is alert and oriented to person, place, and time.  Skin: Skin is warm and dry.  Psychiatric: She has a normal mood and affect.   Results for orders placed or performed during the hospital encounter of 06/30/16 (from the past 24 hour(s))  Urinalysis, Routine w reflex microscopic     Status: None   Collection Time: 06/30/16  5:48 PM  Result Value Ref Range   Color, Urine YELLOW YELLOW   APPearance CLEAR CLEAR   Specific Gravity, Urine 1.020 1.005 - 1.030  pH 6.0 5.0 - 8.0   Glucose, UA NEGATIVE NEGATIVE mg/dL   Hgb urine dipstick NEGATIVE NEGATIVE   Bilirubin Urine NEGATIVE NEGATIVE   Ketones, ur NEGATIVE NEGATIVE mg/dL   Protein, ur NEGATIVE NEGATIVE mg/dL   Nitrite NEGATIVE NEGATIVE   Leukocytes, UA NEGATIVE NEGATIVE  Pregnancy, urine POC     Status: None   Collection Time: 06/30/16  6:02 PM  Result Value Ref Range   Preg Test, Ur NEGATIVE NEGATIVE  hCG, quantitative, pregnancy     Status: None   Collection Time: 06/30/16  6:27 PM  Result  Value Ref Range   hCG, Beta Chain, Quant, S <1 <5 mIU/mL  CBC     Status: None   Collection Time: 06/30/16  6:30 PM  Result Value Ref Range   WBC 7.1 4.0 - 10.5 K/uL   RBC 4.16 3.87 - 5.11 MIL/uL   Hemoglobin 12.1 12.0 - 15.0 g/dL   HCT 72.5 36.6 - 44.0 %   MCV 87.5 78.0 - 100.0 fL   MCH 29.1 26.0 - 34.0 pg   MCHC 33.2 30.0 - 36.0 g/dL   RDW 34.7 42.5 - 95.6 %   Platelets 261 150 - 400 K/uL  Wet prep, genital     Status: Abnormal   Collection Time: 06/30/16  6:47 PM  Result Value Ref Range   Yeast Wet Prep HPF POC NONE SEEN NONE SEEN   Trich, Wet Prep NONE SEEN NONE SEEN   Clue Cells Wet Prep HPF POC NONE SEEN NONE SEEN   WBC, Wet Prep HPF POC FEW (A) NONE SEEN   Sperm NONE SEEN    MAU Course  Procedures  MDM Labs ordered and reviewed. No evidence of pregnancy. No evidence of acute abdominal or pelvic process. Pain could be premenstrual. Stable for discharge.   Assessment and Plan   1. Amenorrhea   2. Negative pregnancy test   3. Lower abdominal pain    Discharge home Follow up with GYN provider as needed Ibuprofen or Aleve prn Return to MAU for OBGYN emergencies  Allergies as of 06/30/2016   No Known Allergies     Medication List    TAKE these medications   PNV PRENATAL PLUS MULTIVITAMIN 27-1 MG Tabs Take 1 tablet by mouth daily.      Donette Larry, CNM 06/30/2016, 8:03 PM

## 2016-06-30 NOTE — MAU Note (Signed)
LMP 4/21 +HPT yesterday  +lower abdominal and back pain; off and on ; none currently  +white vaginal discharge  +Right ear pain, congestion, cough

## 2016-07-01 LAB — GC/CHLAMYDIA PROBE AMP (~~LOC~~) NOT AT ARMC
Chlamydia: NEGATIVE
Neisseria Gonorrhea: NEGATIVE

## 2017-01-09 ENCOUNTER — Other Ambulatory Visit: Payer: Self-pay

## 2017-01-09 ENCOUNTER — Encounter (HOSPITAL_COMMUNITY): Payer: Self-pay | Admitting: Emergency Medicine

## 2017-01-09 ENCOUNTER — Emergency Department (HOSPITAL_COMMUNITY)
Admission: EM | Admit: 2017-01-09 | Discharge: 2017-01-09 | Disposition: A | Discharge status: Home / Self Care | Payer: Medicaid Other | Attending: Emergency Medicine | Admitting: Emergency Medicine

## 2017-01-09 ENCOUNTER — Emergency Department (HOSPITAL_COMMUNITY): Payer: Medicaid Other

## 2017-01-09 DIAGNOSIS — R0789 Other chest pain: Secondary | ICD-10-CM | POA: Insufficient documentation

## 2017-01-09 DIAGNOSIS — M542 Cervicalgia: Secondary | ICD-10-CM | POA: Insufficient documentation

## 2017-01-09 DIAGNOSIS — M546 Pain in thoracic spine: Secondary | ICD-10-CM | POA: Diagnosis not present

## 2017-01-09 DIAGNOSIS — M791 Myalgia, unspecified site: Secondary | ICD-10-CM

## 2017-01-09 HISTORY — DX: Systemic lupus erythematosus, unspecified: M32.9

## 2017-01-09 HISTORY — DX: Reserved for concepts with insufficient information to code with codable children: IMO0002

## 2017-01-09 MED ORDER — METHOCARBAMOL 500 MG PO TABS: 500.0000 mg | ORAL_TABLET | Freq: Two times a day (BID) | ORAL | 0 refills | Status: AC
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
## 2017-01-09 NOTE — ED Notes (Signed)
Declined W/C at D/C and was escorted to lobby by RN. 
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
## 2017-03-31 ENCOUNTER — Ambulatory Visit (HOSPITAL_BASED_OUTPATIENT_CLINIC_OR_DEPARTMENT_OTHER): Payer: Medicaid Other | Attending: Family | Admitting: Internal Medicine

## 2017-03-31 VITALS — Ht 65.0 in | Wt 207.0 lb

## 2017-03-31 DIAGNOSIS — R5383 Other fatigue: Secondary | ICD-10-CM | POA: Diagnosis not present

## 2017-04-07 DIAGNOSIS — R5383 Other fatigue: Secondary | ICD-10-CM

## 2017-04-07 NOTE — Procedures (Signed)
    Patient Name: Eileen Holmes, [name] Study Date: 03/31/2017 Gender: Female D.O.B: 03/31/1980 Age (years): 37 Referring Provider: Malachy Chamberakia Starkes Height (inches): 65 Interpreting Physician: Jetty Duhamellinton Jennene Downie MD, ABSM Weight (lbs): 207 RPSGT: Lise AuerGibson,RPSGT, Theresa BMI: 34 MRN: [id number] Neck Size: 15.00 <br> <br> CLINICAL INFORMATION Sleep Study Type: NPSG Indication for sleep study: Fatigue  Epworth Sleepiness Score: 1  SLEEP STUDY TECHNIQUE As per the AASM Manual for the Scoring of Sleep and Associated Events v2.3 (April 2016) with a hypopnea requiring 4% desaturations.  The channels recorded and monitored were frontal, central and occipital EEG, electrooculogram (EOG), submentalis EMG (chin), nasal and oral airflow, thoracic and abdominal wall motion, anterior tibialis EMG, snore microphone, electrocardiogram, and pulse oximetry.  MEDICATIONS Medications self-administered by patient taken the night of the study : none reported  SLEEP ARCHITECTURE The study was initiated at 10:04:28 PM and ended at 4:15:28 AM.  Sleep onset time was 62.9 minutes and the sleep efficiency was 71.7%%. The total sleep time was 266.1 minutes.  Stage REM latency was 115.0 minutes.  The patient spent 3.6%% of the night in stage N1 sleep, 72.2%% in stage N2 sleep, 8.3%% in stage N3 and 15.97% in REM.  Alpha intrusion was absent.  Supine sleep was 95.11%.  RESPIRATORY PARAMETERS The overall apnea/hypopnea index (AHI) was 1.6 per hour. There were 0 total apneas, including 0 obstructive, 0 central and 0 mixed apneas. There were 7 hypopneas and 41 RERAs.  The AHI during Stage REM sleep was 2.8 per hour.  AHI while supine was 1.7 per hour.  The mean oxygen saturation was 93.5%. The minimum SpO2 during sleep was 85.0%.  snoring was noted during this study.  CARDIAC DATA The 2 lead EKG demonstrated sinus rhythm. The mean heart rate was 63.7 beats per minute. Other EKG findings include: None.  LEG  MOVEMENT DATA The total PLMS were 0 with a resulting PLMS index of 0.0. Associated arousal with leg movement index was 0.0 .  IMPRESSIONS - No significant obstructive sleep apnea occurred during this study (AHI = 1.6/h). - No significant central sleep apnea occurred during this study (CAI = 0.0/h). - Mild oxygen desaturation was noted during this study (Min O2 = 85.0%). Total 0.8 minutes with saturation - No snoring was audible during this study. - No cardiac abnormalities were noted during this study. - Clinically significant periodic limb movements did not occur during sleep. No significant associated arousals.  DIAGNOSIS - Normal study  RECOMMENDATIONS - Be careful with alcohol, sedatives and other CNS depressants that may worsen sleep apnea and disrupt normal sleep architecture. - Sleep hygiene should be reviewed to assess factors that may improve sleep quality. - Weight management and regular exercise should be initiated or continued if appropriate.  [Electronically signed] 04/07/2017 01:31 PM  Jetty Duhamellinton Margel Joens MD, ABSM Diplomate, American Board of Sleep Medicine   NPI: [id number]                         Jetty Duhamellinton Hebert Dooling Diplomate, American Board of Sleep Medicine  ELECTRONICALLY SIGNED ON:  04/07/2017, 1:29 PM [facility] PH: [phone]   FX: [phone] ACCREDITED BY THE AMERICAN ACADEMY OF SLEEP MEDICINE
